# Patient Record
Sex: Female | Born: 1985 | Race: Black or African American | Hispanic: No | Marital: Single | Smoking: Never smoker
Health system: Southern US, Community
[De-identification: ages and names within clinical notes are randomized; demographics above are authoritative.]

## PROBLEM LIST (undated history)

## (undated) DIAGNOSIS — D571 Sickle-cell disease without crisis: Secondary | ICD-10-CM

## (undated) HISTORY — DX: Sickle-cell disease without crisis: D57.1

---
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
## 2003-11-30 ENCOUNTER — Other Ambulatory Visit: Admission: RE | Admit: 2003-11-30 | Discharge: 2003-11-30 | Payer: Self-pay | Admitting: Obstetrics and Gynecology

## 2004-07-09 ENCOUNTER — Other Ambulatory Visit: Admission: RE | Admit: 2004-07-09 | Discharge: 2004-07-09 | Payer: Self-pay | Admitting: Obstetrics and Gynecology

## 2013-08-29 ENCOUNTER — Emergency Department (HOSPITAL_COMMUNITY)
Admission: EM | Admit: 2013-08-29 | Discharge: 2013-08-29 | Disposition: A | Discharge status: Home / Self Care | Payer: Self-pay | Attending: Emergency Medicine | Admitting: Emergency Medicine

## 2013-08-29 ENCOUNTER — Encounter (HOSPITAL_COMMUNITY): Payer: Self-pay | Admitting: Emergency Medicine

## 2013-08-29 DIAGNOSIS — M542 Cervicalgia: Secondary | ICD-10-CM

## 2013-08-29 DIAGNOSIS — M545 Low back pain, unspecified: Secondary | ICD-10-CM

## 2013-08-29 DIAGNOSIS — Y92410 Unspecified street and highway as the place of occurrence of the external cause: Secondary | ICD-10-CM | POA: Insufficient documentation

## 2013-08-29 DIAGNOSIS — Y9389 Activity, other specified: Secondary | ICD-10-CM | POA: Insufficient documentation

## 2013-08-29 DIAGNOSIS — IMO0002 Reserved for concepts with insufficient information to code with codable children: Secondary | ICD-10-CM | POA: Insufficient documentation

## 2013-08-29 DIAGNOSIS — S0993XA Unspecified injury of face, initial encounter: Secondary | ICD-10-CM | POA: Insufficient documentation

## 2013-08-29 DIAGNOSIS — S199XXA Unspecified injury of neck, initial encounter: Principal | ICD-10-CM

## 2013-08-29 MED ORDER — CYCLOBENZAPRINE HCL 10 MG PO TABS: 10.0000 mg | ORAL_TABLET | Freq: Two times a day (BID) | ORAL | Status: DC | PRN

## 2013-08-29 MED ORDER — MELOXICAM 7.5 MG PO TABS: 15.0000 mg | ORAL_TABLET | Freq: Every day | ORAL | Status: DC
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
## 2013-08-29 NOTE — Progress Notes (Signed)
[facility] provided pt with a list of primary care resources and a [facility] [employer] application to help patient establish primary care.  °
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
## 2013-08-29 NOTE — Discharge Instructions (Signed)
Take flexeril as needed for muscle spasms.  It may cause drowsiness, do not drive while taking.  Mobic is an antiinflammatory.  Today for next 7 days, then as needed for pain.

## 2013-08-30 NOTE — ED Provider Notes (Signed)
Medical screening examination/treatment/procedure(s) were performed by non-physician practitioner and as supervising physician I was immediately available for consultation/collaboration.    Shanna CiscoMegan E Docherty, MD 08/30/13 [id number]

## 2014-09-16 ENCOUNTER — Emergency Department (HOSPITAL_BASED_OUTPATIENT_CLINIC_OR_DEPARTMENT_OTHER)
Admission: EM | Admit: 2014-09-16 | Discharge: 2014-09-16 | Disposition: A | Discharge status: Home / Self Care | Payer: 59 | Attending: Emergency Medicine | Admitting: Emergency Medicine

## 2014-09-16 ENCOUNTER — Encounter (HOSPITAL_BASED_OUTPATIENT_CLINIC_OR_DEPARTMENT_OTHER): Payer: Self-pay | Admitting: Emergency Medicine

## 2014-09-16 DIAGNOSIS — Z791 Long term (current) use of non-steroidal anti-inflammatories (NSAID): Secondary | ICD-10-CM | POA: Insufficient documentation

## 2014-09-16 DIAGNOSIS — Z3202 Encounter for pregnancy test, result negative: Secondary | ICD-10-CM | POA: Diagnosis not present

## 2014-09-16 DIAGNOSIS — R300 Dysuria: Secondary | ICD-10-CM | POA: Diagnosis present

## 2014-09-16 DIAGNOSIS — N390 Urinary tract infection, site not specified: Secondary | ICD-10-CM | POA: Diagnosis not present

## 2014-09-16 LAB — URINE MICROSCOPIC-ADD ON

## 2014-09-16 LAB — URINALYSIS, ROUTINE W REFLEX MICROSCOPIC
Bilirubin Urine: NEGATIVE
Glucose, UA: NEGATIVE mg/dL
Ketones, ur: NEGATIVE mg/dL
Nitrite: POSITIVE — AB
Protein, ur: NEGATIVE mg/dL
Specific Gravity, Urine: 1.01 (ref 1.005–1.030)
Urobilinogen, UA: 0.2 mg/dL (ref 0.0–1.0)
pH: 6.5 (ref 5.0–8.0)

## 2014-09-16 LAB — PREGNANCY, URINE: Preg Test, Ur: NEGATIVE

## 2014-09-16 MED ORDER — CEPHALEXIN 500 MG PO CAPS: 500.0000 mg | ORAL_CAPSULE | Freq: Four times a day (QID) | ORAL | Status: DC

## 2014-09-16 MED ORDER — PHENAZOPYRIDINE HCL 200 MG PO TABS: 200.0000 mg | ORAL_TABLET | Freq: Three times a day (TID) | ORAL | Status: DC | PRN

## 2014-09-16 NOTE — ED Provider Notes (Signed)
CSN: [id number]     Arrival date & time 09/16/14  [id number] History   First MD Initiated Contact with Patient 09/16/14 [phone]     Chief Complaint  Patient presents with  . Dysuria     (Consider location/radiation/quality/duration/timing/severity/associated sxs/prior Treatment) HPI Comments: Urinary frequency and dysuria. Symptoms have worsened in the last 24 hours. She has not had any fever, nausea, vomiting, diarrhea or back pain. She reports that today the urine is dark and may have had some blood in it. She denies vaginal bleeding and vaginal discharge.  Patient is a 29 y.o. female presenting with dysuria.  Dysuria   History reviewed. No pertinent past medical history. History reviewed. No pertinent past surgical history. No family history on file. History  Substance Use Topics  . Smoking status: Never Smoker   . Smokeless tobacco: Not on file  . Alcohol Use: Yes     Comment: social   OB History    No data available     Review of Systems  Genitourinary: Positive for dysuria and frequency.  All other systems reviewed and are negative.     Allergies  Review of patient's allergies indicates no known allergies.  Home Medications   Prior to Admission medications   Medication Sig Start Date End Date Taking? Authorizing Provider  cyclobenzaprine (FLEXERIL) 10 MG tablet Take 1 tablet (10 mg total) by mouth 2 (two) times daily as needed for muscle spasms. 08/29/13   Junius FinnerErin O'Malley, PA-C  meloxicam (MOBIC) 7.5 MG tablet Take 2 tablets (15 mg total) by mouth daily. 08/29/13   Junius FinnerErin O'Malley, PA-C   BP 118/58 mmHg  Pulse 77  Temp(Src) 98.8 F (37.1 C) (Oral)  Resp 16  Ht 5\' 1"  (1.549 m)  Wt 128 lb 11.2 oz (58.378 kg)  BMI 24.33 kg/m2  SpO2 100%  LMP 08/23/2014 (Exact Date) Physical Exam  Constitutional: She is oriented to person, place, and time. She appears well-developed and well-nourished. No distress.  HENT:  Head: Normocephalic and atraumatic.  Right Ear: Hearing normal.   Left Ear: Hearing normal.  Nose: Nose normal.  Mouth/Throat: Oropharynx is clear and moist and mucous membranes are normal.  Eyes: Conjunctivae and EOM are normal. Pupils are equal, round, and reactive to light.  Neck: Normal range of motion. Neck supple.  Cardiovascular: Regular rhythm, S1 normal and S2 normal.  Exam reveals no gallop and no friction rub.   No murmur heard. Pulmonary/Chest: Effort normal and breath sounds normal. No respiratory distress. She exhibits no tenderness.  Abdominal: Soft. Normal appearance and bowel sounds are normal. There is no hepatosplenomegaly. There is no tenderness. There is no rebound, no guarding, no tenderness at McBurney's point and negative Murphy's sign. No hernia.  Musculoskeletal: Normal range of motion.  Neurological: She is alert and oriented to person, place, and time. She has normal strength. No cranial nerve deficit or sensory deficit. Coordination normal. GCS eye subscore is 4. GCS verbal subscore is 5. GCS motor subscore is 6.  Skin: Skin is warm, dry and intact. No rash noted. No cyanosis.  Psychiatric: She has a normal mood and affect. Her speech is normal and behavior is normal. Thought content normal.  Nursing note and vitals reviewed.   ED Course  Procedures (including critical care time) Labs Review Labs Reviewed  URINALYSIS, ROUTINE W REFLEX MICROSCOPIC (NOT AT [facility])  PREGNANCY, URINE    Imaging Review No results found.   EKG Interpretation None      MDM   Final diagnoses:  None   UTI  Resents with urinary frequency and dysuria. Urinalysis positive for infection. Treat with Keflex and Pyridium.    Gilda Crease, MD 09/16/14 1047

## 2014-09-16 NOTE — ED Notes (Signed)
Pt states frequency and painful urination, worsening in past 24hours since Tuesday.  Pt also states her urine appears to have a brownish tinge to it today.  Denies any abdominal or back pain.

## 2014-09-16 NOTE — Discharge Instructions (Signed)

## 2014-09-17 ENCOUNTER — Encounter (HOSPITAL_BASED_OUTPATIENT_CLINIC_OR_DEPARTMENT_OTHER): Payer: Self-pay | Admitting: Emergency Medicine

## 2014-09-17 ENCOUNTER — Emergency Department (HOSPITAL_BASED_OUTPATIENT_CLINIC_OR_DEPARTMENT_OTHER)
Admission: EM | Admit: 2014-09-17 | Discharge: 2014-09-17 | Disposition: A | Discharge status: Home / Self Care | Payer: 59 | Attending: Emergency Medicine | Admitting: Emergency Medicine

## 2014-09-17 DIAGNOSIS — Z87440 Personal history of urinary (tract) infections: Secondary | ICD-10-CM | POA: Insufficient documentation

## 2014-09-17 DIAGNOSIS — Z791 Long term (current) use of non-steroidal anti-inflammatories (NSAID): Secondary | ICD-10-CM | POA: Insufficient documentation

## 2014-09-17 DIAGNOSIS — N760 Acute vaginitis: Secondary | ICD-10-CM | POA: Diagnosis not present

## 2014-09-17 DIAGNOSIS — B9689 Other specified bacterial agents as the cause of diseases classified elsewhere: Secondary | ICD-10-CM

## 2014-09-17 DIAGNOSIS — Z792 Long term (current) use of antibiotics: Secondary | ICD-10-CM | POA: Insufficient documentation

## 2014-09-17 DIAGNOSIS — L293 Anogenital pruritus, unspecified: Secondary | ICD-10-CM | POA: Diagnosis present

## 2014-09-17 LAB — WET PREP, GENITAL: TRICH WET PREP: NONE SEEN

## 2014-09-17 MED ORDER — METRONIDAZOLE 500 MG PO TABS: 500.0000 mg | ORAL_TABLET | Freq: Two times a day (BID) | ORAL | Status: DC

## 2014-09-17 NOTE — ED Provider Notes (Signed)
CSN: [id number]     Arrival date & time 09/17/14  1628 History   First MD Initiated Contact with Patient 09/17/14 1811     Chief Complaint  Patient presents with  . Vaginal Itching     (Consider location/radiation/quality/duration/timing/severity/associated sxs/prior Treatment) HPI   Brooke Bridges is a 29 y.o. female complaining of vaginal itching onset today. Patient also started her menstruation today. Patient was seen for UTI yesterday, she has not filled prescription for antibiotics. She denies fever, chills, nausea, vomiting, abdominal pain, flank pain, abnormal vaginal discharge. She states that it'll typically gets this itching sensation when she begins her menses. Does not have OB/GYN.  History reviewed. No pertinent past medical history. History reviewed. No pertinent past surgical history. No family history on file. History  Substance Use Topics  . Smoking status: Never Smoker   . Smokeless tobacco: Not on file  . Alcohol Use: Yes     Comment: social   OB History    No data available     Review of Systems  10 systems reviewed and found to be negative, except as noted in the HPI.   Allergies  Review of patient's allergies indicates no known allergies.  Home Medications   Prior to Admission medications   Medication Sig Start Date End Date Taking? Authorizing Provider  cephALEXin (KEFLEX) 500 MG capsule Take 1 capsule (500 mg total) by mouth 4 (four) times daily. 09/16/14   Gilda Crease, MD  cyclobenzaprine (FLEXERIL) 10 MG tablet Take 1 tablet (10 mg total) by mouth 2 (two) times daily as needed for muscle spasms. 08/29/13   Junius Finner, PA-C  meloxicam (MOBIC) 7.5 MG tablet Take 2 tablets (15 mg total) by mouth daily. 08/29/13   Junius Finner, PA-C  metroNIDAZOLE (FLAGYL) 500 MG tablet Take 1 tablet (500 mg total) by mouth 2 (two) times daily. One po bid x 7 days 09/17/14   Joni Reining Kayton Ripp, PA-C  phenazopyridine (PYRIDIUM) 200 MG tablet Take 1 tablet (200 mg  total) by mouth 3 (three) times daily as needed for pain. 09/16/14   Gilda Crease, MD   BP 128/79 mmHg  Pulse 72  Temp(Src) 98.5 F (36.9 C) (Oral)  Resp 16  Ht  (1.549 m)  Wt 128 lb (58.06 kg)  BMI 24.20 kg/m2  SpO2 100%  LMP 09/17/2014 Physical Exam  Constitutional: She is oriented to person, place, and time. She appears well-developed and well-nourished. No distress.  HENT:  Head: Normocephalic and atraumatic.  Mouth/Throat: Oropharynx is clear and moist.  Eyes: Conjunctivae and EOM are normal. Pupils are equal, round, and reactive to light.  Neck: Normal range of motion.  Cardiovascular: Normal rate, regular rhythm and intact distal pulses.   Pulmonary/Chest: Effort normal and breath sounds normal.  Abdominal: Soft. There is no tenderness.  Genitourinary:  Exam a chaperoned by nurse: No rashes or lesions, there is dark blood pooled in the posterior fornix. No cervical motion or adnexal tenderness.  Musculoskeletal: Normal range of motion.  Neurological: She is alert and oriented to person, place, and time.  Skin: She is not diaphoretic.  Psychiatric: She has a normal mood and affect.  Nursing note and vitals reviewed.   ED Course  Procedures (including critical care time) Labs Review Labs Reviewed  WET PREP, GENITAL - Abnormal; Notable for the following:    Yeast Wet Prep HPF POC FEW (*)    Clue Cells Wet Prep HPF POC MODERATE (*)    WBC, Wet Prep HPF POC  MODERATE (*)    All other components within normal limits  RPR  HIV ANTIBODY (ROUTINE TESTING)  GC/CHLAMYDIA PROBE AMP () NOT AT [facility]    Imaging Review No results found.   EKG Interpretation None      MDM   Final diagnoses:  BV (bacterial vaginosis)    Filed Vitals:   09/17/14 1642 09/17/14 1915 09/17/14 1932  BP: 128/78 139/93 128/79  Pulse: 83 74 72  Temp: 98.5 F (36.9 C)    TempSrc: Oral    Resp: 18 18 16   Height: 5\' 1"  (1.549 m)    Weight: 128 lb (58.06 kg)    SpO2:  100% 100% 100%     [name] is a pleasant 29 y.o. female presenting with vaginal irritation which she gets when she starts her menses. Pelvic exam with no significant abnormality, there is a moderate amount of clue cells and a moderate amount of white blood cells. No cervical motion or adnexal tenderness, patient denies any abnormal vaginal discharge however pruritus may be a symptoms of BV. I've advised her that she will need to follow with OB/GYN on this and I will start her on Flagyl. I've also advised this patient to pick up her antibiotics for her UTI. Patient will be given resource guide so she can establish outpatient care.  Evaluation does not show pathology that would require ongoing emergent intervention or inpatient treatment. Pt is hemodynamically stable and mentating appropriately. Discussed findings and plan with patient/guardian, who agrees with care plan. All questions answered. Return precautions discussed and outpatient follow up given.   Discharge Medication List as of 09/17/2014  7:17 PM    START taking these medications   Details  metroNIDAZOLE (FLAGYL) 500 MG tablet Take 1 tablet (500 mg total) by mouth 2 (two) times daily. One po bid x 7 days, Starting 09/17/2014, Until Discontinued, [employer]             Maalik Pinn, PA-C 09/18/14 [id number]  Rolan BuccoMelanie Belfi, MD 09/18/14 1446

## 2014-09-17 NOTE — Discharge Instructions (Signed)
Take your antibiotics as directed and to completion. You should never have any leftover antibiotics! Push fluids and stay well hydrated.   Any antibiotic use can reduce the efficacy of hormonal birth control. Please use back up method of contraception.   Do not drink alcohol while you are taking flagyl (metronidazole) because it will make you very sick.  Do not hesitate to return to the emergency room for any new, worsening or concerning symptoms.  Please obtain primary care using resource guide below. Let them know that you were seen in the emergency room and that they will need to obtain records for further outpatient management.   Bacterial Vaginosis Bacterial vaginosis is a vaginal infection that occurs when the normal balance of bacteria in the vagina is disrupted. It results from an overgrowth of certain bacteria. This is the most common vaginal infection in women of childbearing age. Treatment is important to prevent complications, especially in pregnant women, as it can cause a premature delivery. CAUSES  Bacterial vaginosis is caused by an increase in harmful bacteria that are normally present in smaller amounts in the vagina. Several different kinds of bacteria can cause bacterial vaginosis. However, the reason that the condition develops is not fully understood. RISK FACTORS Certain activities or behaviors can put you at an increased risk of developing bacterial vaginosis, including:  Having a new sex partner or multiple sex partners.  Douching.  Using an intrauterine device (IUD) for contraception. Women do not get bacterial vaginosis from toilet seats, bedding, swimming pools, or contact with objects around them. SIGNS AND SYMPTOMS  Some women with bacterial vaginosis have no signs or symptoms. Common symptoms include:  Grey vaginal discharge.  A fishlike odor with discharge, especially after sexual intercourse.  Itching or burning of the vagina and vulva.  Burning or  pain with urination. DIAGNOSIS  Your health care provider will take a medical history and examine the vagina for signs of bacterial vaginosis. A sample of vaginal fluid may be taken. Your health care provider will look at this sample under a microscope to check for bacteria and abnormal cells. A vaginal pH test may also be done.  TREATMENT  Bacterial vaginosis may be treated with antibiotic medicines. These may be given in the form of a pill or a vaginal cream. A second round of antibiotics may be prescribed if the condition comes back after treatment.  HOME CARE INSTRUCTIONS   Only take over-the-counter or prescription medicines as directed by your health care provider.  If antibiotic medicine was prescribed, take it as directed. Make sure you finish it even if you start to feel better.  Do not have sex until treatment is completed.  Tell all sexual partners that you have a vaginal infection. They should see their health care provider and be treated if they have problems, such as a mild rash or itching.  Practice safe sex by using condoms and only having one sex partner. SEEK MEDICAL CARE IF:   Your symptoms are not improving after 3 days of treatment.  You have increased discharge or pain.  You have a fever. MAKE SURE YOU:   Understand these instructions.  Will watch your condition.  Will get help right away if you are not doing well or get worse. FOR MORE INFORMATION  Centers for Disease Control and Prevention, Division of STD Prevention: SolutionApps.co.za American Sexual Health Association (ASHA): www.ashastd.org  Document Released: 03/31/2005 Document Revised: 01/19/2013 Document Reviewed: 11/10/2012 [facility] Patient Information 2015 [place], [place]. This information is  not intended to replace advice given to you by your health care provider. Make sure you discuss any questions you have with your health care provider.   Emergency Department Resource Guide 1) Find a Doctor and  Pay Out of Pocket Although you won't have to find out who is covered by your insurance plan, it is a good idea to ask around and get recommendations. You will then need to call the office and see if the doctor you have chosen will accept you as a new patient and what types of options they offer for patients who are self-pay. Some doctors offer discounts or will set up payment plans for their patients who do not have insurance, but you will need to ask so you aren't surprised when you get to your appointment.  2) Contact Your Local Health Department Not all health departments have doctors that can see patients for sick visits, but many do, so it is worth a call to see if yours does. If you don't know where your local health department is, you can check in your phone book. The CDC also has a tool to help you locate your state's health department, and many state websites also have listings of all of their local health departments.  3) Find a Walk-in Clinic If your illness is not likely to be very severe or complicated, you may want to try a walk in clinic. These are popping up all over the country in pharmacies, drugstores, and shopping centers. They're usually staffed by nurse practitioners or physician assistants that have been trained to treat common illnesses and complaints. They're usually fairly quick and inexpensive. However, if you have serious medical issues or chronic medical problems, these are probably not your best option.  No Primary Care Doctor: - Call Health Connect at  [phone] - they can help you locate a primary care doctor that  accepts your insurance, provides certain services, etc. - Physician Referral Service- [phone]  Chronic Pain Problems: Organization         Address  Phone   Notes  [facility]  [phone] Patients need to be referred by their primary care doctor.   Medication Assistance: Organization         Address  Phone   Notes  [facility] Medication [facility] [address] [place] [place], [place] [address] [phone] --Must be a resident of [facility] -- Must have NO insurance coverage whatsoever (no Medicaid/ Medicare, etc.) -- The pt. MUST have a primary care doctor that directs their care regularly and follows them in the community   Med[facility]ist  [phone]   [employer]  [phone]    Agencies that provide inexpensive medical care: Organization         Address  Phone   Notes  [facility]  [phone]   [facility]    [phone]   [facility] [address] [place], [place] [address] [phone]   [facility] [address] [place], [place] [phone]   Planned Parenthood    [phone]   [facility]    [phone]   Community Health and [facility]  [address], [place] Phone:  [phone], Fax:  [phone] Hours of Operation:  9 am - 6 pm, M-F.  Also accepts Medicaid/Medicare and self-pay.  [facility]  [address], [place] Phone: [phone], Fax: [phone]. Hours of Operation:  8:30 am - 5:30 pm, M-F.  Also accepts Medicaid and self-pay.  [facility] [place] [address] [place] Phone: [phone]   [facility] [address] Natasha Bence [place], [place] [phone], Ext. [phone] Mondays & Thursdays: 7-9 AM.  First 15 patients are seen on a first come, first serve basis.    Medicaid-accepting [facility] Providers:  Organization         Address  Phone   Notes  [facility] [address],  [phone] Also accepts self-pay patients.  [facility] [address] Laurell Josephs [place], [place]  [phone]   [facility] [address], [place] [phone]   [facility] [address], [place] [phone]   Renaye Rakers [address], [place]   [phone] Only accepts [place] patients after they have their name applied to their card.   Self-Pay (no insurance) in [facility]:  Organization         Address  Phone   Notes  Sickle Cell Patients, [facility] [address] [place], [place] [phone]   [facility] [address] [place], [place] [phone]   [facility] [place]  [address], [place] [phone]   [facility]/Dr. Osei-Bonsu  [address], [place] or [address], [place] [phone] Phone number for both [place] and [place] locations is the same.  [facility] [address] [place] [phone]   [facility] [address] [place] or [address] [phone]   [facility] [address] [place] [phone], phone; [phone], fax Sees patients 1st and 3rd Saturday of every month.  Must not qualify for public or private insurance (i.e. Medicaid, Medicare, [place] Health Choice, Veterans' Benefits)  Household income should be no more than 200% of the poverty level The clinic cannot treat you if you are pregnant or think you are pregnant  Sexually transmitted diseases are not treated at the clinic.    Dental Care: Organization         Address  Phone  Notes  [facility] Department of [facility] [address] [place], [place] [phone] Accepts children up to age 9 who are enrolled in [place] or [place] Health Choice; pregnant women with a Medicaid card; and children who have applied for Medicaid or [place] Health Choice, but were declined, whose parents can pay a reduced fee at time of service.  [facility] Department of [facility]  [address], [place] [phone] Accepts children up to age 33 who are enrolled in [place] or [place] Health Choice; pregnant women with a Medicaid card; and children who have applied for Medicaid or  Health Choice, but were declined, whose parents can pay a reduced fee at time of service.  [facility]  [address] [place], [place] [phone] Patients are seen by appointment only. Walk-ins are not accepted. [facility] will see patients 53 years of age and older. Monday - Tuesday (8am-5pm) Most Wednesdays (8:30-5pm) $30 per visit, cash only  [facility]  [address], [facility] [phone] Patients are seen by appointment only. Walk-ins are not accepted. [facility] will see patients 58 years of age and older. One Wednesday Evening (Monthly: Volunteer Based).  $30 per visit, cash only  [employer]  [phone] for adults; Children under age 87, call Graduate Pediatric Dentistry at [phone]. Children aged 81-14, please call [phone] to request a pediatric application.  Dental services are provided in all areas of dental care including fillings, crowns and bridges, complete and partial dentures, implants, gum treatment, root canals, and extractions. Preventive care is also provided. Treatment is provided to both adults and children. Patients are selected via a lottery and there is often a waiting list.   [facility] [address], [place]  [phone] [email or web address]   [facility] [address] [place], [place] [phone], Ext. [phone] Second and Fourth Thursday of each month, opens at 6:30 AM; Clinic ends at 9 AM.  Patients are seen on a first-come first-served basis, and a limited number are seen during each clinic.   [facility]  [address] Ether Griffins [place], [place] [phone]   Eligibility Requirements You must have lived in [place], [place], or  [place] counties for at least the last three months.   You cannot be eligible for state or federal sponsored [place], including [employer], [place], or [employer].   You generally cannot be eligible for healthcare insurance through your employer.    How to apply: Eligibility screenings are held every Tuesday and Wednesday afternoon from 1:00 pm until 4:00 pm. You do not need an appointment for the interview!  [facility] [address] [place], [place] [phone]   [facility]  [phone]   [facility] Health Department  [phone]   [facility] Health Department  [phone]    Behavioral Health Resources in the Community: Intensive Outpatient Programs Organization         Address  Phone  Notes  [facility] [address] [place], [place] [phone]   [facility] Outpatient [address], [place], [place] [phone]   [facility] [address], [place], [place]  [phone]   [facility] [address],  [place], [place] [phone] or [phone]   Substance Abuse Resources Organization         Address  Phone  Notes  [facility] Drug Services  [phone]   [facility]  [phone]   [facility] [place]  [phone]   Floydene Flock  [phone]   Residential & Outpatient Substance Abuse Program  [phone]   Psychological Services Organization         Address  Phone  Notes  [facility] Behavioral Health  [phone]   [facility]  [phone]   [facility] Mental Health [address] [place] [phone] or [phone]    Mobile Crisis Teams Organization         Address  Phone  Notes  [facility], Mobile Crisis Care Unit  [phone]   [facility]  [address] [place], [place] [phone]   Doristine Locks [address] [place] [phone]    Self-Help/Support Groups Organization         Address  Phone             Notes  [facility] [place] -  variety of support groups  [phone]- [phone] Call for more information  Narcotics Anonymous (NA), [facility] [address], [place]  2 meetings at this location   Residential [facility] administratorTreatment Programs Organization         Address  Phone  Notes  [facility] [address],    [place]  [phone]   [facility]  [address], [place] [phone], [place], [place] [phone]   [facility] [address], [place] [phone] Admissions: 8am-3pm M-F  [facility] [address]    [place], [place] [phone]   [facility] [address], [place], [place] [phone]   [facility] [address]  [place], [place] [phone]   [facility] - Intensive Outpatient [address], [place], [place] [phone]   [facility] [address]  [place], [place] [phone] or [phone]   [facility] ([facility]) [address] [place], [place] [phone] Accepts Medicaid  [facility] [address]  [place] [phone] Substance Abuse/Addiction Treatment   [facility] Behavioral Health Resources Organization         Address  Phone  Notes  [facility]  [phone]) [phone]   Angie FavaJulie Brannon, PhD [address] [place], [place]   [phone](336) [phone] or [phone](336) [phone]   [facility]   [address] [place], [place] [phone]) [phone]   [facility] [address], [place], [place] [phone](336) [phone] Insurance/Medicaid/sponsorship through [facility]  [facility] [address]                                    [place], [place] [phone]) [phone] Therapy/tele-psych/case  [facility] [address]   [place], [place] [phone]     Dr. Lolly MustacheArfeen  [phone](336) [phone]   [facility] [place] [facility]  [employer] [facility] 1) [address], [place] 2) [address], [place] 3)  [address] [place] [address], [place] [phone]) [phone]) [phone]  [phone](336) [phone]   [facility] Child Abuse Hotline [phone](336) 2030972704 or [phone](336) 7475318374 (After Hours)

## 2014-09-17 NOTE — ED Notes (Signed)
Pt was seen here yesterday for dysuria.  She has not taken any rx's antibiotics yet bc today she woke up with vaginal "discomfort and itching."  Pt started her menstrual period this morning.

## 2014-09-17 NOTE — ED Notes (Signed)
Pt also states headache and dizziness last night.

## 2014-09-18 LAB — GC/CHLAMYDIA PROBE AMP (~~LOC~~) NOT AT ARMC
CHLAMYDIA, DNA PROBE: NEGATIVE
Neisseria Gonorrhea: NEGATIVE

## 2014-09-19 LAB — RPR: RPR Ser Ql: NONREACTIVE

## 2014-09-19 LAB — HIV ANTIBODY (ROUTINE TESTING W REFLEX): HIV SCREEN 4TH GENERATION: NONREACTIVE

## 2014-11-02 ENCOUNTER — Encounter: Payer: 59 | Admitting: Family Medicine

## 2016-09-27 ENCOUNTER — Emergency Department (HOSPITAL_COMMUNITY)
Admission: EM | Admit: 2016-09-27 | Discharge: 2016-09-27 | Discharge status: Against Medical Advice | Payer: 59 | Attending: Emergency Medicine | Admitting: Emergency Medicine

## 2016-09-27 ENCOUNTER — Encounter (HOSPITAL_COMMUNITY): Payer: Self-pay

## 2016-09-27 DIAGNOSIS — Y929 Unspecified place or not applicable: Secondary | ICD-10-CM | POA: Insufficient documentation

## 2016-09-27 DIAGNOSIS — Y998 Other external cause status: Secondary | ICD-10-CM | POA: Diagnosis not present

## 2016-09-27 DIAGNOSIS — T07XXXA Unspecified multiple injuries, initial encounter: Secondary | ICD-10-CM | POA: Diagnosis present

## 2016-09-27 DIAGNOSIS — Y939 Activity, unspecified: Secondary | ICD-10-CM | POA: Insufficient documentation

## 2016-09-27 DIAGNOSIS — W57XXXA Bitten or stung by nonvenomous insect and other nonvenomous arthropods, initial encounter: Secondary | ICD-10-CM | POA: Insufficient documentation

## 2016-09-27 NOTE — ED Triage Notes (Signed)
Pt complaining of bed bugs in home. Pt with multiple bites to body.

## 2018-02-05 ENCOUNTER — Encounter (HOSPITAL_BASED_OUTPATIENT_CLINIC_OR_DEPARTMENT_OTHER): Payer: Self-pay

## 2018-02-05 ENCOUNTER — Other Ambulatory Visit: Payer: Self-pay

## 2018-02-05 ENCOUNTER — Emergency Department (HOSPITAL_BASED_OUTPATIENT_CLINIC_OR_DEPARTMENT_OTHER)
Admission: EM | Admit: 2018-02-05 | Discharge: 2018-02-05 | Disposition: A | Discharge status: Home / Self Care | Payer: 59 | Attending: Emergency Medicine | Admitting: Emergency Medicine

## 2018-02-05 DIAGNOSIS — Z79899 Other long term (current) drug therapy: Secondary | ICD-10-CM | POA: Diagnosis not present

## 2018-02-05 DIAGNOSIS — K5900 Constipation, unspecified: Secondary | ICD-10-CM | POA: Diagnosis not present

## 2018-02-05 DIAGNOSIS — R1010 Upper abdominal pain, unspecified: Secondary | ICD-10-CM | POA: Diagnosis present

## 2018-02-05 DIAGNOSIS — D649 Anemia, unspecified: Secondary | ICD-10-CM | POA: Diagnosis not present

## 2018-02-05 LAB — CBC WITH DIFFERENTIAL/PLATELET
Abs Immature Granulocytes: 0.02 10*3/uL (ref 0.00–0.07)
BASOS ABS: 0.1 10*3/uL (ref 0.0–0.1)
Basophils Relative: 1 %
Eosinophils Absolute: 0.1 10*3/uL (ref 0.0–0.5)
Eosinophils Relative: 3 %
HCT: 31 % — ABNORMAL LOW (ref 36.0–46.0)
Hemoglobin: 9.5 g/dL — ABNORMAL LOW (ref 12.0–15.0)
Immature Granulocytes: 0 %
Lymphocytes Relative: 42 %
Lymphs Abs: 2.1 10*3/uL (ref 0.7–4.0)
MCH: 21.5 pg — ABNORMAL LOW (ref 26.0–34.0)
MCHC: 30.6 g/dL (ref 30.0–36.0)
MCV: 70.3 fL — ABNORMAL LOW (ref 80.0–100.0)
Monocytes Absolute: 0.5 10*3/uL (ref 0.1–1.0)
Monocytes Relative: 10 %
NEUTROS PCT: 44 %
NRBC: 0 % (ref 0.0–0.2)
Neutro Abs: 2.1 10*3/uL (ref 1.7–7.7)
Platelets: 413 10*3/uL — ABNORMAL HIGH (ref 150–400)
RBC: 4.41 MIL/uL (ref 3.87–5.11)
RDW: 17.9 % — ABNORMAL HIGH (ref 11.5–15.5)
WBC: 4.9 10*3/uL (ref 4.0–10.5)

## 2018-02-05 LAB — COMPREHENSIVE METABOLIC PANEL
ALBUMIN: 3.9 g/dL (ref 3.5–5.0)
ALT: 23 U/L (ref 0–44)
ANION GAP: 9 (ref 5–15)
AST: 31 U/L (ref 15–41)
Alkaline Phosphatase: 61 U/L (ref 38–126)
BUN: 10 mg/dL (ref 6–20)
CO2: 22 mmol/L (ref 22–32)
Calcium: 8.5 mg/dL — ABNORMAL LOW (ref 8.9–10.3)
Chloride: 104 mmol/L (ref 98–111)
Creatinine, Ser: 0.68 mg/dL (ref 0.44–1.00)
GFR calc Af Amer: >60 mL/min (ref >60)
GFR calc non Af Amer: >60 mL/min (ref >60)
GLUCOSE: 82 mg/dL (ref 70–99)
Potassium: 3.8 mmol/L (ref 3.5–5.1)
SODIUM: 135 mmol/L (ref 135–145)
Total Bilirubin: 0.4 mg/dL (ref 0.3–1.2)
Total Protein: 7.3 g/dL (ref 6.5–8.1)

## 2018-02-05 LAB — PREGNANCY, URINE: Preg Test, Ur: NEGATIVE

## 2018-02-05 LAB — LIPASE, BLOOD: Lipase: 31 U/L (ref 11–51)

## 2018-02-05 NOTE — ED Triage Notes (Signed)
C/o abd pain, nausea, constipation-sx started yesterday-NAD-steady gait

## 2018-02-05 NOTE — Discharge Instructions (Signed)
Use 1-2 capfuls of miralax daily.  Increase the amount of water your drinking. Your urine should be clear to pale yellow.  Increase your fiber intake.  Follow up with your primary care doctor if you continue to feel like you are not evacuating completely. Follow up with your primary care regarding your low blood counts.  Return to the ER if you develop fevers, severe abdominal pain, inability to have a bowel movement/not passing gas, persistent vomiting, or any new or concerning symptoms.

## 2018-02-05 NOTE — ED Notes (Signed)
C/o abd pain w nausea onset yesterday increased urinary freq,  Denies burning,  No vag dc

## 2018-02-05 NOTE — ED Provider Notes (Signed)
[facility] EMERGENCY DEPARTMENT Provider Note   CSN: [id number] Arrival date & time: 02/05/18  1446     History   Chief Complaint Chief Complaint  Patient presents with  . Abdominal Pain    HPI Brooke Bridges is a 32 y.o. female presenting for evaluation of abdominal discomfort.  Patient states 2 days ago, she had an episode of upper abdominal discomfort with nausea.  She drinks ginger ale, and the nausea resolved.  Since then, she has been having intermittent episodes of upper abdominal discomfort.  This is more likely to happen when she is moving or bending.  Additionally, she states she is not pooping like normal.  She states she has to strain a lot to have a bowel movement, and she does not feel like she is fully evacuating.  She reports decreased appetite, but no pain or nausea with PO intake.  Her last bowel movement was today, it was nonbloody.  She denies fevers, chills, chest pain, shortness of breath, current nausea, vomiting, or urinary symptoms.  She denies vaginal discharge.  Her last period was 2 weeks ago.  She has not taken anything for her symptoms.  She thinks this might be due to constipation, but was hesitant to take a laxative because she wanted to make sure this was the cause.  She has no history of abdominal surgeries.  HPI  History reviewed. No pertinent past medical history.  There are no active problems to display for this patient.   History reviewed. No pertinent surgical history.   OB History   None      Home Medications    Prior to Admission medications   Medication Sig Start Date End Date Taking? Authorizing Provider  cephALEXin (KEFLEX) 500 MG capsule Take 1 capsule (500 mg total) by mouth 4 (four) times daily. 09/16/14   Gilda Crease, MD  cyclobenzaprine (FLEXERIL) 10 MG tablet Take 1 tablet (10 mg total) by mouth 2 (two) times daily as needed for muscle spasms. 08/29/13   Lurene Shadow, PA-C  meloxicam (MOBIC) 7.5 MG tablet  Take 2 tablets (15 mg total) by mouth daily. 08/29/13   Lurene Shadow, PA-C  metroNIDAZOLE (FLAGYL) 500 MG tablet Take 1 tablet (500 mg total) by mouth 2 (two) times daily. One po bid x 7 days 09/17/14   Pisciotta, Joni Reining, PA-C  phenazopyridine (PYRIDIUM) 200 MG tablet Take 1 tablet (200 mg total) by mouth 3 (three) times daily as needed for pain. 09/16/14   Gilda Crease, MD    Family History No family history on file.  Social History Social History   Tobacco Use  . Smoking status: Never Smoker  . Smokeless tobacco: Never Used  Substance Use Topics  . Alcohol use: Yes    Comment: occ  . Drug use: No     Allergies   Patient has no known allergies.   Review of Systems Review of Systems  Gastrointestinal: Positive for abdominal pain (abdominal discomfort), constipation (straining) and nausea (resolved).  All other systems reviewed and are negative.    Physical Exam Updated Vital Signs BP 132/75 (BP Location: Left Arm)   Pulse 68   Temp 98.7 F (37.1 C) (Oral)   Resp 18   Ht 5\' 1"  (1.549 m)   Wt 63 kg   LMP 01/22/2018   SpO2 100%   BMI 26.26 kg/m   Physical Exam  Constitutional: She is oriented to person, place, and time. She appears well-developed and well-nourished. No distress.  Resting comfortably in the bed in no acute distress  HENT:  Head: Normocephalic and atraumatic.  Eyes: Pupils are equal, round, and reactive to light. Conjunctivae and EOM are normal.  Neck: Normal range of motion. Neck supple.  Cardiovascular: Normal rate, regular rhythm and intact distal pulses.  Pulmonary/Chest: Effort normal and breath sounds normal. No respiratory distress. She has no wheezes.  Abdominal: Soft. She exhibits no distension and no mass. There is no tenderness. There is no rebound and no guarding.  No tenderness palpation of the abdomen.  Soft without rigidity, guarding, distention.  No obvious masses.  Negative rebound.  No signs of parotitis.  Musculoskeletal:  Normal range of motion.  Neurological: She is alert and oriented to person, place, and time.  Skin: Skin is warm and dry.  Psychiatric: She has a normal mood and affect.  Nursing note and vitals reviewed.    ED Treatments / Results  Labs (all labs ordered are listed, but only abnormal results are displayed) Labs Reviewed  CBC WITH DIFFERENTIAL/PLATELET - Abnormal; Notable for the following components:      Result Value   Hemoglobin 9.5 (*)    HCT 31.0 (*)    MCV 70.3 (*)    MCH 21.5 (*)    RDW 17.9 (*)    Platelets 413 (*)    All other components within normal limits  COMPREHENSIVE METABOLIC PANEL - Abnormal; Notable for the following components:   Calcium 8.5 (*)    All other components within normal limits  LIPASE, BLOOD  PREGNANCY, URINE    EKG None  Radiology No results found.  Procedures Procedures (including critical care time)  Medications Ordered in ED Medications - No data to display   Initial Impression / Assessment and Plan / ED Course  I have reviewed the triage vital signs and the nursing notes.  Pertinent labs & imaging results that were available during my care of the patient were reviewed by me and considered in my medical decision making (see chart for details).     Resenting for evaluation of abdominal discomfort and increased straining while having a bowel movement.  Physical exam reassuring, she is afebrile not tachycardic.  Appears nontoxic.  Abdominal exam without tenderness.  Patient reports her last bowel movement was today.  She is still passing gas.  Low suspicion for obstruction, patient without previous surgeries and no risk factors for obstruction.  Patient without fevers, chills, or acute pain.  Low suspicion for infection.  Visual history obtained from chart review, patient noticed with irritable bowel syndrome.  Patient is concerned that she might have diabetes, because she has a family history of this.  Will obtain basic abdominal labs  and reassess.  Labs reassuring, no leukocytosis.  Kidney, liver, pancreatic function reassuring.  Hemoglobin slightly low at 9.5.  Per chart review, this is baseline for patient.  No obvious bleeding source.  This is likely chronic.  BGL is reassuring, discussed with patient.  Discussed her symptoms are likely multifactorial, including dehydration, poor diet, and incomplete evacuation.  Discussed importance of dietary changes.  Further information given in paperwork.  Discussed follow-up with primary care as needed for further evaluation and for evalaution of anemia.  Discussed use of MiraLAX to encourage daily bowel movements.  At this time, patient appears safe for discharge.  Low suspicion for intra-abdominal infection, perforation, obstruction, or emergent etiology.  Strict return precautions given.  Patient states she understands and agrees plan.   Final Clinical Impressions(s) / ED Diagnoses  Final diagnoses:  Constipation, unspecified constipation type  Anemia, unspecified type    ED Discharge Orders    None       Alveria Apley, PA-C 02/05/18 1625    Arby Barrette, MD 02/17/18 [phone]

## 2018-06-27 ENCOUNTER — Encounter (HOSPITAL_COMMUNITY): Payer: Self-pay | Admitting: *Deleted

## 2018-06-27 ENCOUNTER — Other Ambulatory Visit: Payer: Self-pay

## 2018-06-27 ENCOUNTER — Emergency Department (HOSPITAL_COMMUNITY)
Admission: EM | Admit: 2018-06-27 | Discharge: 2018-06-27 | Disposition: A | Discharge status: Home / Self Care | Payer: 59 | Attending: Emergency Medicine | Admitting: Emergency Medicine

## 2018-06-27 DIAGNOSIS — J069 Acute upper respiratory infection, unspecified: Secondary | ICD-10-CM | POA: Insufficient documentation

## 2018-06-27 DIAGNOSIS — Z79899 Other long term (current) drug therapy: Secondary | ICD-10-CM | POA: Insufficient documentation

## 2018-06-27 DIAGNOSIS — B9789 Other viral agents as the cause of diseases classified elsewhere: Secondary | ICD-10-CM

## 2018-06-27 DIAGNOSIS — R05 Cough: Secondary | ICD-10-CM | POA: Diagnosis present

## 2018-06-27 NOTE — ED Triage Notes (Signed)
Pt states she has been coughing, body aches, fatigue for the past 2 days. Pt states her symptoms are improving but is not completely better and is not sure if she should go to work tomorrow.

## 2018-06-27 NOTE — Discharge Instructions (Signed)
I recommend continuing over-the-counter medications as you have been.  You may also want to do ibuprofen and Tylenol as prescribed at counter for your headache.  Make sure to get plenty of rest and drink plenty of fluids.  Please return to the emergency department if you develop any new or worsening symptoms.

## 2018-06-27 NOTE — ED Provider Notes (Signed)
[facility]-EMERGENCY DEPT Provider Note   CSN: [id number] Arrival date & time: 06/27/18  1651    History   Chief Complaint Chief Complaint  Patient presents with  . Cough  . Generalized Body Aches    HPI Brooke Bridges is a 33 y.o. female who presents with a 4-day history of cough, sore throat, nasal congestion, body aches, chills, headache.  Patient denies any known fever.  She reports she has begun to feel better, however she is wanting to be checked out and determine if she should go back to work.  She has had some bilateral rib pain worse with coughing.  She denies any abdominal pain, nausea, vomiting, urinary symptoms.  She denies any recent travel or known sick contacts.  Patient does work with the public, however though.  Patient has been taking Goody powders and Alka-Seltzer with some relief.     HPI  History reviewed. No pertinent past medical history.  There are no active problems to display for this patient.   History reviewed. No pertinent surgical history.   OB History   No obstetric history on file.      Home Medications    Prior to Admission medications   Medication Sig Start Date End Date Taking? Authorizing Provider  cephALEXin (KEFLEX) 500 MG capsule Take 1 capsule (500 mg total) by mouth 4 (four) times daily. 09/16/14   Gilda Crease, MD  cyclobenzaprine (FLEXERIL) 10 MG tablet Take 1 tablet (10 mg total) by mouth 2 (two) times daily as needed for muscle spasms. 08/29/13   Lurene Shadow, PA-C  meloxicam (MOBIC) 7.5 MG tablet Take 2 tablets (15 mg total) by mouth daily. 08/29/13   Lurene Shadow, PA-C  metroNIDAZOLE (FLAGYL) 500 MG tablet Take 1 tablet (500 mg total) by mouth 2 (two) times daily. One po bid x 7 days 09/17/14   Pisciotta, Joni Reining, PA-C  phenazopyridine (PYRIDIUM) 200 MG tablet Take 1 tablet (200 mg total) by mouth 3 (three) times daily as needed for pain. 09/16/14   Gilda Crease, MD    Family History No  family history on file.  Social History Social History   Tobacco Use  . Smoking status: Never Smoker  . Smokeless tobacco: Never Used  Substance Use Topics  . Alcohol use: Yes    Comment: occ  . Drug use: No     Allergies   Patient has no known allergies.   Review of Systems Review of Systems  Constitutional: Positive for chills. Negative for fever.  HENT: Positive for congestion and sore throat. Negative for facial swelling.   Respiratory: Positive for cough. Negative for shortness of breath.   Cardiovascular: Negative for chest pain.  Gastrointestinal: Negative for abdominal pain, nausea and vomiting.  Genitourinary: Negative for dysuria.  Musculoskeletal: Negative for back pain and neck pain.  Skin: Negative for rash and wound.  Neurological: Positive for headaches.  Psychiatric/Behavioral: The patient is not nervous/anxious.      Physical Exam Updated Vital Signs BP 132/84 (BP Location: Left Arm)   Pulse 84   Temp 98.5 F (36.9 C) (Oral)   Resp 18   LMP 06/13/2018   SpO2 99%   Physical Exam Vitals signs and nursing note reviewed.  Constitutional:      General: She is not in acute distress.    Appearance: She is well-developed. She is not diaphoretic.  HENT:     Head: Normocephalic and atraumatic.     Right Ear: Tympanic membrane normal.  Left Ear: Tympanic membrane normal.     Nose: Congestion present.     Mouth/Throat:     Pharynx: No oropharyngeal exudate or posterior oropharyngeal erythema.     Tonsils: No tonsillar exudate or tonsillar abscesses. Swelling: 1+ on the right. 1+ on the left.  Eyes:     General: No scleral icterus.       Right eye: No discharge.        Left eye: No discharge.     Conjunctiva/sclera: Conjunctivae normal.     Pupils: Pupils are equal, round, and reactive to light.  Neck:     Musculoskeletal: Normal range of motion and neck supple.     Thyroid: No thyromegaly.  Cardiovascular:     Rate and Rhythm: Normal rate and  regular rhythm.     Heart sounds: Normal heart sounds. No murmur. No friction rub. No gallop.   Pulmonary:     Effort: Pulmonary effort is normal. No respiratory distress.     Breath sounds: Normal breath sounds. No stridor. No wheezing or rales.  Chest:    Abdominal:     General: Bowel sounds are normal. There is no distension.     Palpations: Abdomen is soft.     Tenderness: There is no abdominal tenderness. There is no guarding or rebound.  Lymphadenopathy:     Cervical: No cervical adenopathy.  Skin:    General: Skin is warm and dry.     Coloration: Skin is not pale.     Findings: No rash.  Neurological:     Mental Status: She is alert.     Coordination: Coordination normal.      ED Treatments / Results  Labs (all labs ordered are listed, but only abnormal results are displayed) Labs Reviewed - No data to display  EKG None  Radiology No results found.  Procedures Procedures (including critical care time)  Medications Ordered in ED Medications - No data to display   Initial Impression / Assessment and Plan / ED Course  I have reviewed the triage vital signs and the nursing notes.  Pertinent labs & imaging results that were available during my care of the patient were reviewed by me and considered in my medical decision making (see chart for details).        Patient with probable viral URI.  Lungs are clear to auscultation.  No indication for chest x-ray at this time.  Patient is very well-appearing in no acute distress and is afebrile.  Patient would like to continue taking her over-the-counter medications.  Patient advised to return if anything is worsening.  Patient understands and agrees with plan.  Patient vital stable throughout ED course and discharged in satisfactory condition.  Final Clinical Impressions(s) / ED Diagnoses   Final diagnoses:  Viral URI with cough    ED Discharge Orders    None       Emi Holes, PA-C 06/27/18 1740     Melene Plan, DO 06/27/18 1740

## 2018-12-23 ENCOUNTER — Other Ambulatory Visit: Payer: Self-pay

## 2018-12-23 DIAGNOSIS — Z20822 Contact with and (suspected) exposure to covid-19: Secondary | ICD-10-CM

## 2018-12-25 ENCOUNTER — Emergency Department (HOSPITAL_COMMUNITY): Payer: 59

## 2018-12-25 ENCOUNTER — Other Ambulatory Visit: Payer: Self-pay

## 2018-12-25 ENCOUNTER — Emergency Department (HOSPITAL_COMMUNITY)
Admission: EM | Admit: 2018-12-25 | Discharge: 2018-12-25 | Disposition: A | Discharge status: Home / Self Care | Payer: 59 | Attending: Emergency Medicine | Admitting: Emergency Medicine

## 2018-12-25 ENCOUNTER — Encounter (HOSPITAL_COMMUNITY): Payer: Self-pay | Admitting: Emergency Medicine

## 2018-12-25 DIAGNOSIS — Z79899 Other long term (current) drug therapy: Secondary | ICD-10-CM | POA: Insufficient documentation

## 2018-12-25 DIAGNOSIS — J069 Acute upper respiratory infection, unspecified: Secondary | ICD-10-CM | POA: Insufficient documentation

## 2018-12-25 LAB — NOVEL CORONAVIRUS, NAA: SARS-CoV-2, NAA: NOT DETECTED

## 2018-12-25 MED ORDER — ONDANSETRON 4 MG PO TBDP: 4.0000 mg | ORAL_TABLET | Freq: Three times a day (TID) | ORAL | 0 refills | Status: DC | PRN

## 2018-12-25 MED ORDER — NAPROXEN 500 MG PO TABS: 500.0000 mg | ORAL_TABLET | Freq: Two times a day (BID) | ORAL | 0 refills | Status: DC | PRN

## 2018-12-25 MED ORDER — BENZONATATE 100 MG PO CAPS: 100.0000 mg | ORAL_CAPSULE | Freq: Three times a day (TID) | ORAL | 0 refills | Status: DC | PRN

## 2018-12-25 NOTE — ED Provider Notes (Signed)
[facility]-EMERGENCY DEPT Provider Note   CSN: [id number] Arrival date & time: 12/25/18  1147     History   Chief Complaint Chief Complaint  Patient presents with  . Nasal Congestion  . Sore Throat    HPI Brooke Bridges is a 33 y.o. female.     The history is provided by the patient and medical records. No language interpreter was used.   Brooke Bridges is an otherwise healthy 33 y.o. female who presents to the Emergency Department complaining of intermittently productive cough for the last 4 to 5 days.  Associated with nasal congestion, sore throat, nausea and fatigue.  Has a known sick contact with similar symptoms he was tested for coronavirus, but does not have results back.  [facility] but her results are not back either.  She states they told her he would be about 24 to 36 hours, so she should have results soon.  Denies any chest pain or shortness of breath.  No abdominal pain or vomiting.  She had 1 loose stool this morning without any blood.  No medications prior to arrival for her symptoms.  History reviewed. No pertinent past medical history.  There are no active problems to display for this patient.   History reviewed. No pertinent surgical history.   OB History   No obstetric history on file.      Home Medications    Prior to Admission medications   Medication Sig Start Date End Date Taking? Authorizing Provider  benzonatate (TESSALON) 100 MG capsule Take 1 capsule (100 mg total) by mouth 3 (three) times daily as needed for cough. 12/25/18   Esparanza Krider, Chase PicketJaime Pilcher, PA-C  cephALEXin (KEFLEX) 500 MG capsule Take 1 capsule (500 mg total) by mouth 4 (four) times daily. 09/16/14   Gilda CreasePollina, Christopher J, MD  cyclobenzaprine (FLEXERIL) 10 MG tablet Take 1 tablet (10 mg total) by mouth 2 (two) times daily as needed for muscle spasms. 08/29/13   Lurene ShadowPhelps, Erin O, PA-C  meloxicam (MOBIC) 7.5 MG tablet Take 2  tablets (15 mg total) by mouth daily. 08/29/13   Lurene ShadowPhelps, Erin O, PA-C  metroNIDAZOLE (FLAGYL) 500 MG tablet Take 1 tablet (500 mg total) by mouth 2 (two) times daily. One po bid x 7 days 09/17/14   Pisciotta, Joni ReiningNicole, PA-C  naproxen (NAPROSYN) 500 MG tablet Take 1 tablet (500 mg total) by mouth 2 (two) times daily as needed for mild pain or moderate pain. 12/25/18   Sheleen Conchas, Chase PicketJaime Pilcher, PA-C  ondansetron (ZOFRAN ODT) 4 MG disintegrating tablet Take 1 tablet (4 mg total) by mouth every 8 (eight) hours as needed for nausea or vomiting. 12/25/18   Abri Vacca, Chase PicketJaime Pilcher, PA-C  phenazopyridine (PYRIDIUM) 200 MG tablet Take 1 tablet (200 mg total) by mouth 3 (three) times daily as needed for pain. 09/16/14   Gilda CreasePollina, Christopher J, MD    Family History No family history on file.  Social History Social History   Tobacco Use  . Smoking status: Never Smoker  . Smokeless tobacco: Never Used  Substance Use Topics  . Alcohol use: Yes    Comment: occ  . Drug use: No     Allergies   Patient has no known allergies.   Review of Systems Review of Systems  HENT: Positive for congestion and sore throat.   Respiratory: Positive for cough. Negative for shortness of breath and wheezing.   Gastrointestinal: Positive for diarrhea and nausea. Negative for abdominal  pain, constipation and vomiting.  All other systems reviewed and are negative.    Physical Exam Updated Vital Signs BP (!) 159/72   Pulse (!) 59   Temp 98.2 F (36.8 C) (Oral)   Resp 16   LMP 11/27/2018   SpO2 100%   Physical Exam Vitals signs and nursing note reviewed.  Constitutional:      General: She is not in acute distress.    Appearance: She is well-developed.     Comments: Nontoxic-appearing.  HENT:     Head: Normocephalic and atraumatic.     Mouth/Throat:     Comments: Scant erythema.  No tonsillar hypertrophy or exudates. Neck:     Musculoskeletal: Neck supple.  Cardiovascular:     Rate and Rhythm: Normal rate and regular  rhythm.     Heart sounds: Normal heart sounds. No murmur.  Pulmonary:     Effort: Pulmonary effort is normal. No respiratory distress.     Breath sounds: Normal breath sounds.     Comments: Lungs clear to auscultation bilaterally.  Speaking in full sentences without any difficulty. Abdominal:     General: There is no distension.     Palpations: Abdomen is soft.     Comments: No abdominal tenderness.  Skin:    General: Skin is warm and dry.  Neurological:     Mental Status: She is alert and oriented to person, place, and time.      ED Treatments / Results  Labs (all labs ordered are listed, but only abnormal results are displayed) Labs Reviewed - No data to display  EKG None  Radiology Dg Chest Portable 1 View  Result Date: 12/25/2018 CLINICAL DATA:  Acute cough, shortness of breath and congestion. EXAM: PORTABLE CHEST 1 VIEW COMPARISON:  None. FINDINGS: The cardiomediastinal silhouette is unremarkable. There is no evidence of focal airspace disease, pulmonary edema, suspicious pulmonary nodule/mass, pleural effusion, or pneumothorax. No acute bony abnormalities are identified. IMPRESSION: No active disease. Electronically Signed   By: Margarette Canada M.D.   On: 12/25/2018 14:16    Procedures Procedures (including critical care time)  Medications Ordered in ED Medications - No data to display   Initial Impression / Assessment and Plan / ED Course  I have reviewed the triage vital signs and the nursing notes.  Pertinent labs & imaging results that were available during my care of the patient were reviewed by me and considered in my medical decision making (see chart for details).       Brooke Bridges is a 33 y.o. female who presents to ED for cough, congestion, sore throat, nausea and one episode of diarrhea this morning. On exam, patient is afebrile, non-toxic appearing with a clear lung exam.  No abdominal tenderness.  No exudates or tonsillar hypertrophy.  She was tested  for coronavirus yesterday at [facility], but results are not available yet.   CXR without acute findings.   Sxs today likely due to viral URI, covid possible. She already has testing pending.  Symptomatic home care instructions discussed. Rx for symptomatic management given. PCP follow up strongly encouraged if symptoms persist. Reasons to return to ER discussed. All questions answered.     Final Clinical Impressions(s) / ED Diagnoses   Final diagnoses:  Upper respiratory tract infection, unspecified type    ED Discharge Orders         Ordered    ondansetron (ZOFRAN ODT) 4 MG disintegrating tablet  Every 8 hours PRN     12/25/18 1423  benzonatate (TESSALON) 100 MG capsule  3 times daily PRN     12/25/18 1423    naproxen (NAPROSYN) 500 MG tablet  2 times daily PRN     12/25/18 1423           Toia Micale, Chase Picket, PA-C 12/25/18 1431    Melene Plan, DO 12/25/18 1437

## 2018-12-25 NOTE — ED Triage Notes (Signed)
Pt reports since Tuesday had sore throat and nasal congestion. Was tested for Covid yesterday at [facility] but doesn't have results back yet. Reports someone she was around was tested but doesn't know his results but he was given medications for cough and congestion.

## 2018-12-25 NOTE — Discharge Instructions (Signed)
It was my pleasure taking care of you today!   Tessalon as needed for cough, zofran as needed for nausea.  Naproxen as needed for pain.  Flonase and mucinex for nasal congestion, Rest, drink plenty of fluids to be sure you are staying hydrated.   Please follow up with your primary doctor for discussion of your diagnoses and further evaluation after today's visit if symptoms persist longer than 7 days; Return to the ER for high fevers, difficulty breathing or other concerning symptoms.

## 2019-08-11 ENCOUNTER — Encounter (HOSPITAL_COMMUNITY): Payer: Self-pay

## 2019-08-11 ENCOUNTER — Other Ambulatory Visit: Payer: Self-pay

## 2019-08-11 DIAGNOSIS — Z79899 Other long term (current) drug therapy: Secondary | ICD-10-CM | POA: Insufficient documentation

## 2019-08-11 DIAGNOSIS — Z2913 Encounter for prophylactic Rho(D) immune globulin: Secondary | ICD-10-CM | POA: Insufficient documentation

## 2019-08-11 DIAGNOSIS — Z3A08 8 weeks gestation of pregnancy: Secondary | ICD-10-CM | POA: Insufficient documentation

## 2019-08-11 LAB — COMPREHENSIVE METABOLIC PANEL
ALT: 22 U/L (ref 0–44)
AST: 35 U/L (ref 15–41)
Albumin: 4.3 g/dL (ref 3.5–5.0)
Alkaline Phosphatase: 73 U/L (ref 38–126)
Anion gap: 6 (ref 5–15)
BUN: 11 mg/dL (ref 6–20)
CO2: 26 mmol/L (ref 22–32)
Calcium: 9.1 mg/dL (ref 8.9–10.3)
Chloride: 101 mmol/L (ref 98–111)
Creatinine, Ser: 0.77 mg/dL (ref 0.44–1.00)
GFR calc Af Amer: >60 mL/min (ref >60)
GFR calc non Af Amer: >60 mL/min (ref >60)
Glucose, Bld: 92 mg/dL (ref 70–99)
Potassium: 3.8 mmol/L (ref 3.5–5.1)
Sodium: 133 mmol/L — ABNORMAL LOW (ref 135–145)
Total Bilirubin: 0.6 mg/dL (ref 0.3–1.2)
Total Protein: 8.2 g/dL — ABNORMAL HIGH (ref 6.5–8.1)

## 2019-08-11 LAB — CBC WITH DIFFERENTIAL/PLATELET
Abs Immature Granulocytes: 0.02 10*3/uL (ref 0.00–0.07)
Basophils Absolute: 0 10*3/uL (ref 0.0–0.1)
Basophils Relative: 1 %
Eosinophils Absolute: 0.2 10*3/uL (ref 0.0–0.5)
Eosinophils Relative: 3 %
HCT: 36.3 % (ref 36.0–46.0)
Hemoglobin: 11.5 g/dL — ABNORMAL LOW (ref 12.0–15.0)
Immature Granulocytes: 0 %
Lymphocytes Relative: 25 %
Lymphs Abs: 1.4 10*3/uL (ref 0.7–4.0)
MCH: 24.9 pg — ABNORMAL LOW (ref 26.0–34.0)
MCHC: 31.7 g/dL (ref 30.0–36.0)
MCV: 78.6 fL — ABNORMAL LOW (ref 80.0–100.0)
Monocytes Absolute: 0.4 10*3/uL (ref 0.1–1.0)
Monocytes Relative: 8 %
Neutro Abs: 3.6 10*3/uL (ref 1.7–7.7)
Neutrophils Relative %: 63 %
Platelets: 400 10*3/uL (ref 150–400)
RBC: 4.62 MIL/uL (ref 3.87–5.11)
RDW: 16.6 % — ABNORMAL HIGH (ref 11.5–15.5)
WBC: 5.7 10*3/uL (ref 4.0–10.5)
nRBC: 0 % (ref 0.0–0.2)

## 2019-08-11 LAB — I-STAT BETA HCG BLOOD, ED (MC, WL, AP ONLY): I-stat hCG, quantitative: 210.2 m[IU]/mL — ABNORMAL HIGH (ref <5)

## 2019-08-11 NOTE — ED Triage Notes (Signed)
Pt sts she had an abortion at [redacted]wk pregnant. Took the pill 4/5 and 4/6. Yesterday noticed mild bleeding with some clots. Sts she has went through one pad.

## 2019-08-11 NOTE — ED Notes (Signed)
2 gold tops in lab

## 2019-08-12 ENCOUNTER — Emergency Department (HOSPITAL_COMMUNITY)
Admission: EM | Admit: 2019-08-12 | Discharge: 2019-08-12 | Disposition: A | Discharge status: Home / Self Care | Payer: Self-pay | Attending: Emergency Medicine | Admitting: Emergency Medicine

## 2019-08-12 ENCOUNTER — Emergency Department (HOSPITAL_COMMUNITY): Payer: Self-pay

## 2019-08-12 DIAGNOSIS — O034 Incomplete spontaneous abortion without complication: Secondary | ICD-10-CM

## 2019-08-12 MED ORDER — RHO D IMMUNE GLOBULIN 1500 UNIT/2ML IJ SOSY
300.0000 ug | PREFILLED_SYRINGE | Freq: Once | INTRAMUSCULAR | Status: AC
  Administered 2019-08-12: 300 ug via INTRAMUSCULAR
  Filled 2019-08-12: qty 2

## 2019-08-12 MED ORDER — MISOPROSTOL 200 MCG PO TABS
800.0000 ug | ORAL_TABLET | Freq: Once | ORAL | 0 refills | Status: DC
Start: 2019-08-12 — End: 2021-11-06

## 2019-08-12 NOTE — ED Provider Notes (Signed)
[facility] DEPT Provider Note   CSN: [id number] Arrival date & time: 08/11/19  2209     History Chief Complaint  Patient presents with  . Vaginal Bleeding    Brooke Bridges is a 34 y.o. female.  The history is provided by the patient and medical records.  Vaginal Bleeding Associated symptoms: abdominal pain (cramping)     34 y.o. G1P0 s/p abortion with cytotec on 4/5 and 4/6 at [redacted] weeks gestation, presenting to the ED for vaginal bleeding.  Patient reports after her abortion, she had intense cramping and passage of clots/blood which stopped after a few days.  States yesterday she started bleeding again with recurrent passage of clots (largest was about size of a lemon).  She has had some cramping and sensation of bloating/swelling in her lower abdomen.  She has only used 1 pad today.  No dizziness/lightheadedness.  No fever/chills. No urinary symptoms.  Denies vaginal discharge.  She has been sexually active since abortion earlier this month.  She is not currently established with GYN.  History reviewed. No pertinent past medical history.  There are no problems to display for this patient.   History reviewed. No pertinent surgical history.   OB History   No obstetric history on file.     No family history on file.  Social History   Tobacco Use  . Smoking status: Never Smoker  . Smokeless tobacco: Never Used  Substance Use Topics  . Alcohol use: Yes    Comment: occ  . Drug use: No    Home Medications Prior to Admission medications   Medication Sig Start Date End Date Taking? Authorizing Provider  benzonatate (TESSALON) 100 MG capsule Take 1 capsule (100 mg total) by mouth 3 (three) times daily as needed for cough. 12/25/18   Ward, Ozella Almond, PA-C  cephALEXin (KEFLEX) 500 MG capsule Take 1 capsule (500 mg total) by mouth 4 (four) times daily. 09/16/14   Orpah Greek, MD  cyclobenzaprine (FLEXERIL) 10 MG tablet Take 1 tablet  (10 mg total) by mouth 2 (two) times daily as needed for muscle spasms. 08/29/13   Noe Gens, PA-C  meloxicam (MOBIC) 7.5 MG tablet Take 2 tablets (15 mg total) by mouth daily. 08/29/13   Noe Gens, PA-C  metroNIDAZOLE (FLAGYL) 500 MG tablet Take 1 tablet (500 mg total) by mouth 2 (two) times daily. One po bid x 7 days 09/17/14   Pisciotta, Elmyra Ricks, PA-C  naproxen (NAPROSYN) 500 MG tablet Take 1 tablet (500 mg total) by mouth 2 (two) times daily as needed for mild pain or moderate pain. 12/25/18   Ward, Ozella Almond, PA-C  ondansetron (ZOFRAN ODT) 4 MG disintegrating tablet Take 1 tablet (4 mg total) by mouth every 8 (eight) hours as needed for nausea or vomiting. 12/25/18   Ward, Ozella Almond, PA-C  phenazopyridine (PYRIDIUM) 200 MG tablet Take 1 tablet (200 mg total) by mouth 3 (three) times daily as needed for pain. 09/16/14   Orpah Greek, MD    Allergies    Patient has no known allergies.  Review of Systems   Review of Systems  Gastrointestinal: Positive for abdominal pain (cramping).  Genitourinary: Positive for vaginal bleeding.  All other systems reviewed and are negative.   Physical Exam Updated Vital Signs BP 138/81 (BP Location: Left Arm)   Pulse 86   Temp 98.3 F (36.8 C)   Resp 16   Ht 5\' 1"  (1.549 m)   Wt 68 kg  SpO2 100%   BMI 28.34 kg/m   Physical Exam Vitals and nursing note reviewed.  Constitutional:      Appearance: She is well-developed.  HENT:     Head: Normocephalic and atraumatic.  Eyes:     Conjunctiva/sclera: Conjunctivae normal.     Pupils: Pupils are equal, round, and reactive to light.  Cardiovascular:     Rate and Rhythm: Normal rate and regular rhythm.     Heart sounds: Normal heart sounds.  Pulmonary:     Effort: Pulmonary effort is normal. No respiratory distress.     Breath sounds: Normal breath sounds. No rhonchi.  Abdominal:     General: Bowel sounds are normal.     Palpations: Abdomen is soft.  Musculoskeletal:         General: Normal range of motion.     Cervical back: Normal range of motion.  Skin:    General: Skin is warm and dry.  Neurological:     Mental Status: She is alert and oriented to person, place, and time.     ED Results / Procedures / Treatments   Labs (all labs ordered are listed, but only abnormal results are displayed) Labs Reviewed  CBC WITH DIFFERENTIAL/PLATELET - Abnormal; Notable for the following components:      Result Value   Hemoglobin 11.5 (*)    MCV 78.6 (*)    MCH 24.9 (*)    RDW 16.6 (*)    All other components within normal limits  COMPREHENSIVE METABOLIC PANEL - Abnormal; Notable for the following components:   Sodium 133 (*)    Total Protein 8.2 (*)    All other components within normal limits  I-STAT BETA HCG BLOOD, ED ([facility], [facility], [facility] ONLY) - Abnormal; Notable for the following components:   I-stat hCG, quantitative 210.2 (*)    All other components within normal limits  RH IG WORKUP (INCLUDES ABO/RH)    EKG None  Radiology US OB Comp Less 14 Wks  Result Date: 08/12/2019 CLINICAL DATA:  Bleeding after an abortion EXAM: TRANSVAGINAL OB ULTRASOUND; OBSTETRIC <14 WK ULTRASOUND TECHNIQUE: Transvaginal ultrasound was performed for complete evaluation of the gestation as well as the maternal uterus, adnexal regions, and pelvic cul-de-sac. COMPARISON:  None. FINDINGS: Intrauterine gestational sac: None Subchorionic hemorrhage:  None visualized. Maternal uterus/adnexae: There is a fibroid uterus seen along the posterior left uterine fundus measuring 3.1 x 3.1 x 3.1 cm. There is a large amount of echogenic heterogeneous hypervascular material seen within the endometrial canal. The endometrium measures up to 2.7 cm. The uterus measures 8.6 x 5.2 x 7.7 cm with a volume of 182 mL. The right ovary measures 4.2 x 2.7 x 2.8 cm with a volume of 16.5 mL and a corpus luteum cyst present. The left ovary measures 4.1 x 1.9 x 3.3 cm with a volume of 13.7 mL. There is trace free fluid  within the cul-de-sac. IMPRESSION: Findings suggestive of a large amount of retained products of conception within the endometrial canal. Electronically Signed   By: Jonna Clark M.D.   On: 08/12/2019 03:52   US OB Transvaginal  Result Date: 08/12/2019 CLINICAL DATA:  Bleeding after an abortion EXAM: TRANSVAGINAL OB ULTRASOUND; OBSTETRIC <14 WK ULTRASOUND TECHNIQUE: Transvaginal ultrasound was performed for complete evaluation of the gestation as well as the maternal uterus, adnexal regions, and pelvic cul-de-sac. COMPARISON:  None. FINDINGS: Intrauterine gestational sac: None Subchorionic hemorrhage:  None visualized. Maternal uterus/adnexae: There is a fibroid uterus seen along the posterior left  uterine fundus measuring 3.1 x 3.1 x 3.1 cm. There is a large amount of echogenic heterogeneous hypervascular material seen within the endometrial canal. The endometrium measures up to 2.7 cm. The uterus measures 8.6 x 5.2 x 7.7 cm with a volume of 182 mL. The right ovary measures 4.2 x 2.7 x 2.8 cm with a volume of 16.5 mL and a corpus luteum cyst present. The left ovary measures 4.1 x 1.9 x 3.3 cm with a volume of 13.7 mL. There is trace free fluid within the cul-de-sac. IMPRESSION: Findings suggestive of a large amount of retained products of conception within the endometrial canal. Electronically Signed   By: Jonna Clark M.D.   On: 08/12/2019 03:52    Procedures Procedures (including critical care time)  Medications Ordered in ED Medications - No data to display  ED Course  I have reviewed the triage vital signs and the nursing notes.  Pertinent labs & imaging results that were available during my care of the patient were reviewed by me and considered in my medical decision making (see chart for details).    MDM Rules/Calculators/A&P  34 y.o.  G1P0 s/p abortion on 4/5 with Cytotec at [redacted] weeks gestation, here with vaginal bleeding.  States immediately after the medication, symptoms took usual  course that she was told about.  Vaginal bleeding stopped and returned yesterday. States recurrent passage of clots, largest size of a lemon.  Has only used 1 pad in 24 hours.  She is afebrile, non-toxic.  Abdomen overall soft and non-tender.  She does report some lower abdominal cramping and bloating/swelling sensation.  Labs today with hcg of 210.  Suspect this is down trending but unsure what her numbers were initially.  She has been sexually active since her abortion as well.  Hemoglobin is stable.  Will obtain US to r/o retained products or other acute findings.  ABO/Rh pending.  4:16 AM [place] does reveal large amount of retained products.  Discussed with on call OB-GYN, Dr. Cristina Gong-- as patient is hemodynamically stable with essentially normal hemoglobin, recommends repeat cytotec 800mg  orally, repeat in 24 hours if needed.  Will need close follow-up in clinic.    Results discussed with patient.  She is agreeable to repeat dosing of cytotec.  She is aware of expected symptoms after taking this (cramping, bleeding, etc).   She is O neg, given dose of rhogam here.   She will arrange follow-up in women's clinic.  Return here for any new/acute changes-- severe pain, excessive bleeding, fevers, lightheadedness, syncope, etc.  Final Clinical Impression(s) / ED Diagnoses Final diagnoses:  Retained products of conception following abortion    Rx / DC Orders ED Discharge Orders         Ordered    misoprostol (CYTOTEC) 200 MCG tablet   Once     08/12/19 0456           08/14/19, PA-C 08/12/19 08/14/19    [id number], MD 08/12/19 [phone]

## 2019-08-12 NOTE — Discharge Instructions (Addendum)
Take the prescribed medication as directed.   Follow-up with the women's outpatient clinic-- I would go ahead and call this morning to get an appt scheduled for follow-up in about 5-7 days. Return to the ED for new or worsening symptoms.

## 2019-08-13 LAB — RH IG WORKUP (INCLUDES ABO/RH)
ABO/RH(D): O NEG
Antibody Screen: NEGATIVE
Gestational Age(Wks): 0
Unit division: 0
Unit tag comment: 0

## 2020-07-13 HISTORY — PX: WISDOM TOOTH EXTRACTION: SHX21

## 2020-07-31 DIAGNOSIS — D259 Leiomyoma of uterus, unspecified: Secondary | ICD-10-CM | POA: Diagnosis not present

## 2020-07-31 DIAGNOSIS — D251 Intramural leiomyoma of uterus: Secondary | ICD-10-CM | POA: Diagnosis not present

## 2020-07-31 DIAGNOSIS — N838 Other noninflammatory disorders of ovary, fallopian tube and broad ligament: Secondary | ICD-10-CM | POA: Diagnosis not present

## 2020-10-04 IMAGING — DX DG CHEST 1V PORT
1 series · 1 of 1 positions shown · non-contrast
Comparison: None.

CLINICAL DATA: Acute cough, shortness of breath and congestion.

EXAM:
PORTABLE CHEST 1 VIEW

[chest ap]
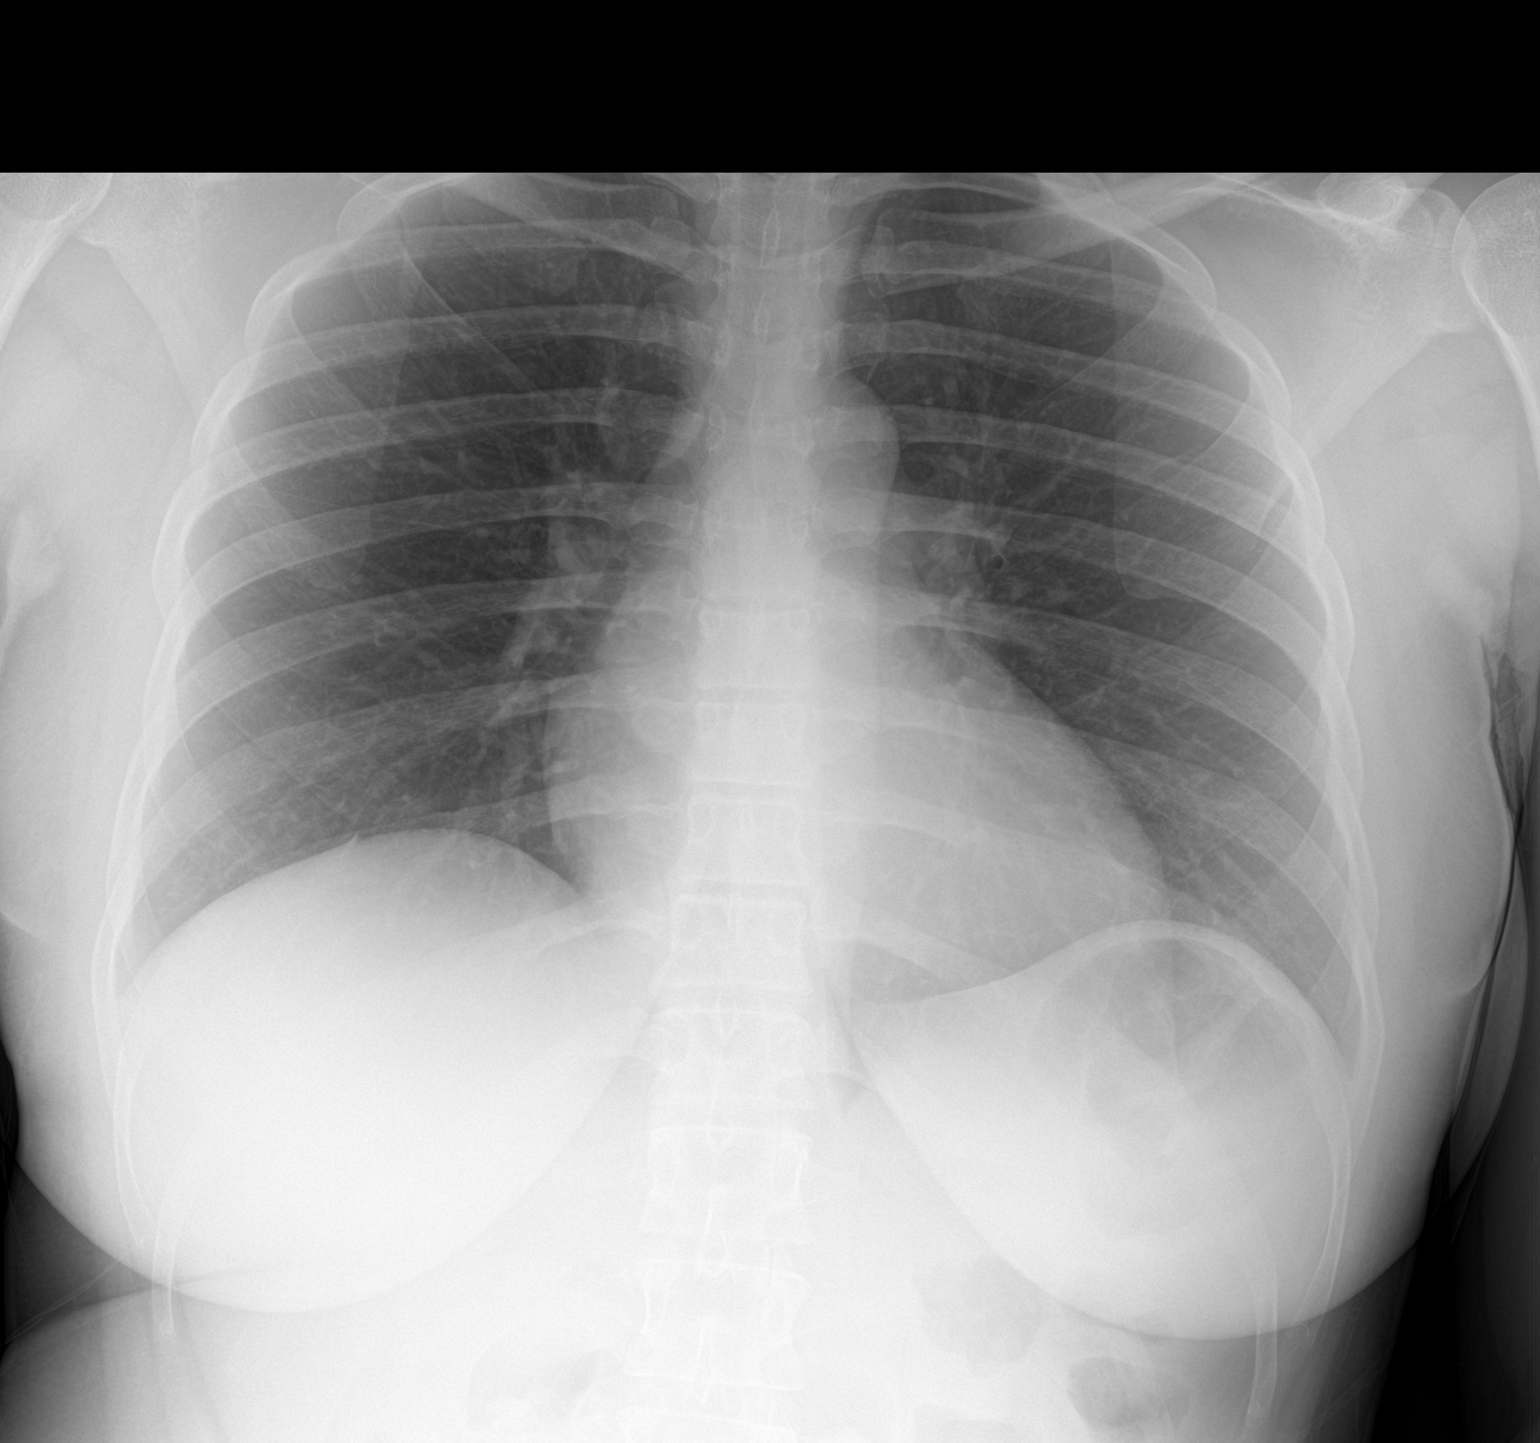

[1 of 1 positions shown; findings below may reference images not displayed]

FINDINGS: The cardiomediastinal silhouette is unremarkable.

There is no evidence of focal airspace disease, pulmonary edema,
suspicious pulmonary nodule/mass, pleural effusion, or pneumothorax.

No acute bony abnormalities are identified.
IMPRESSION: No active disease.

## 2020-10-08 DIAGNOSIS — Z86018 Personal history of other benign neoplasm: Secondary | ICD-10-CM | POA: Diagnosis not present

## 2020-10-08 DIAGNOSIS — Z862 Personal history of diseases of the blood and blood-forming organs and certain disorders involving the immune mechanism: Secondary | ICD-10-CM | POA: Diagnosis not present

## 2020-10-08 DIAGNOSIS — Z131 Encounter for screening for diabetes mellitus: Secondary | ICD-10-CM | POA: Diagnosis not present

## 2020-10-08 DIAGNOSIS — Z0000 Encounter for general adult medical examination without abnormal findings: Secondary | ICD-10-CM | POA: Diagnosis not present

## 2021-05-22 IMAGING — US US OB COMP LESS 14 WK
1 series · 14 of 28 positions shown · non-contrast
Comparison: None.

CLINICAL DATA: Bleeding after an abortion

EXAM:
TRANSVAGINAL OB ULTRASOUND; OBSTETRIC <14 WK ULTRASOUND
TECHNIQUE: Transvaginal ultrasound was performed for complete evaluation of the
gestation as well as the maternal uterus, adnexal regions, and
pelvic cul-de-sac.

[Series 1: us ob comp less 14 wk · 59 acquisitions, 14 frames shown]
[im 3/59]
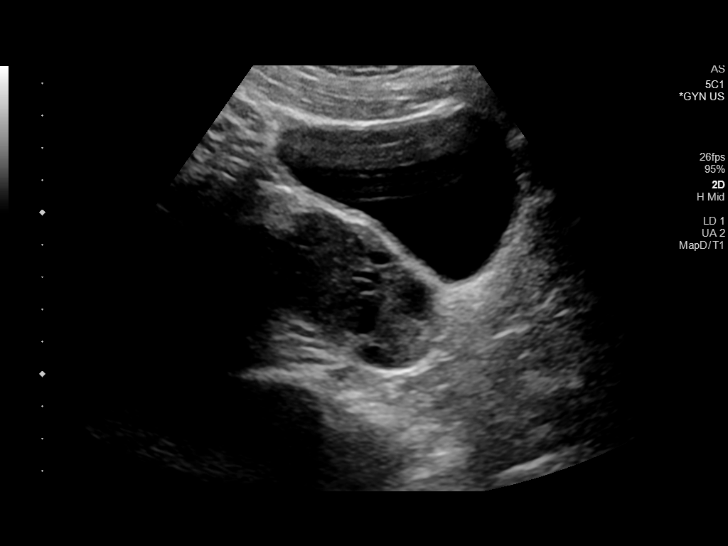
[im 7/59]
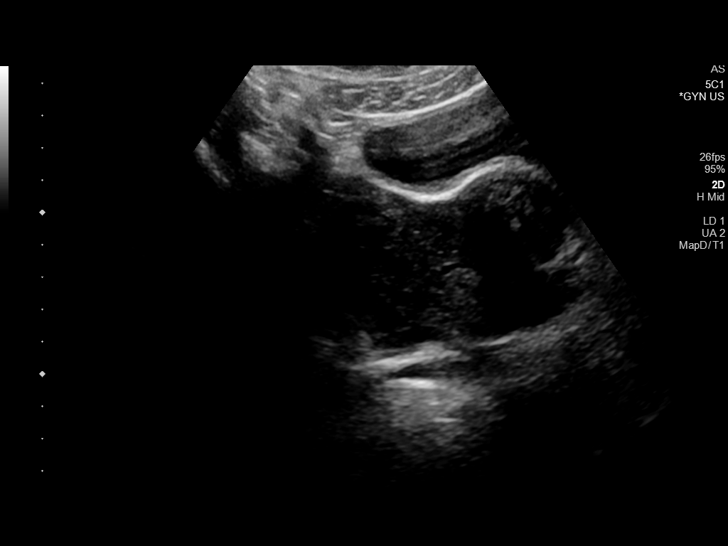
[im 11/59]
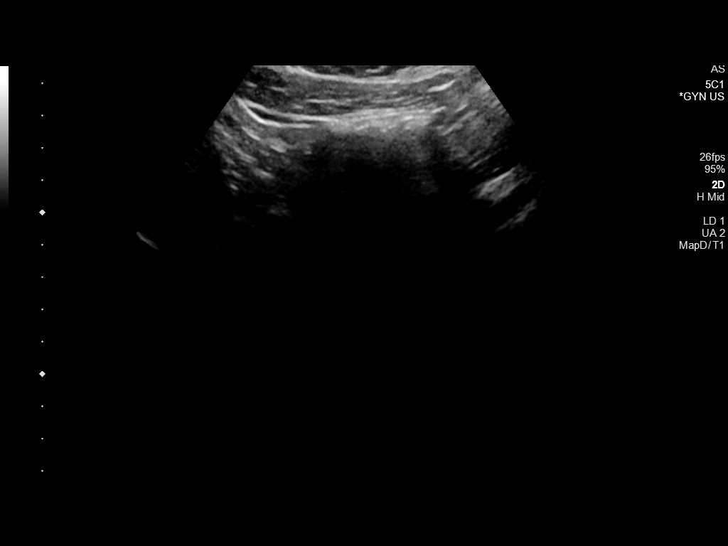
[im 16/59]
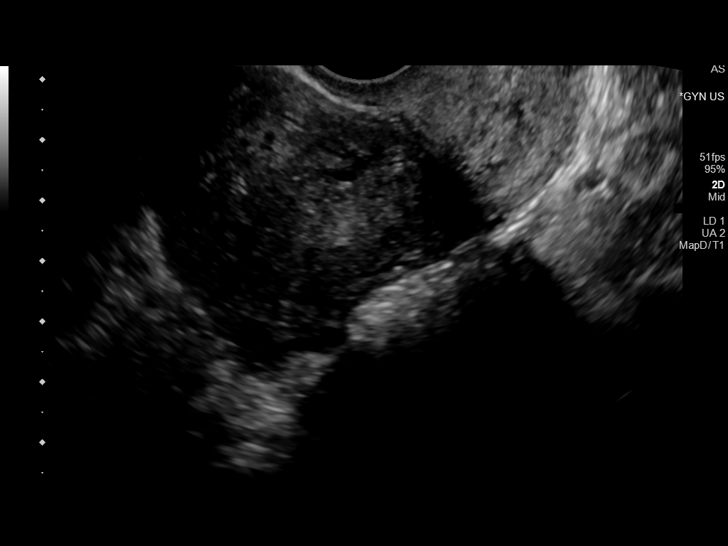
[im 20/59]
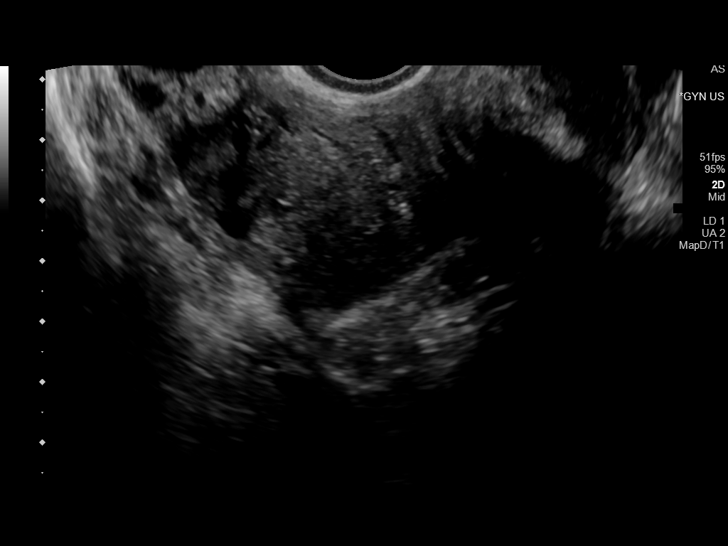
[im 24/59]
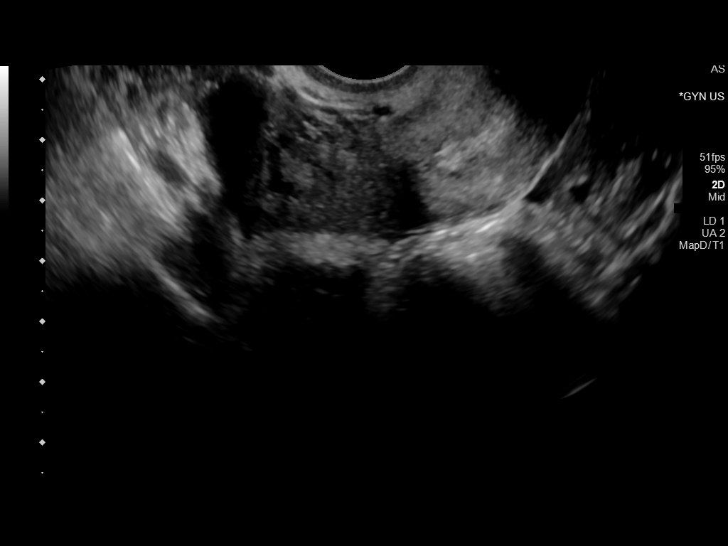
[im 28/59]
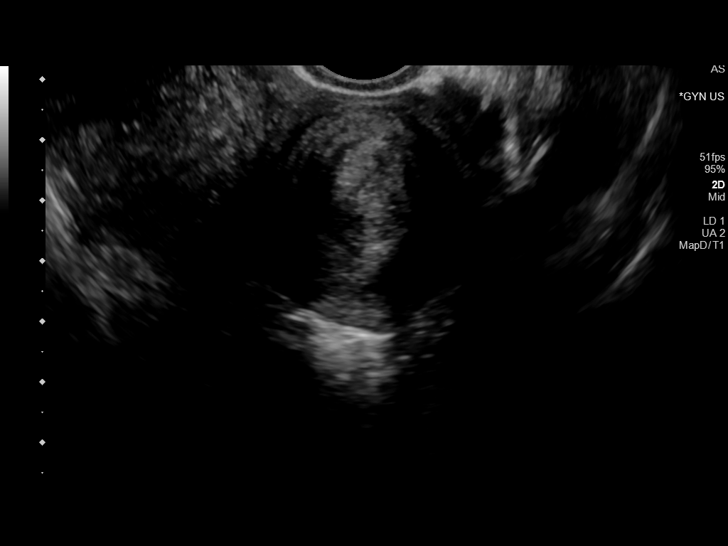
[im 33/59]
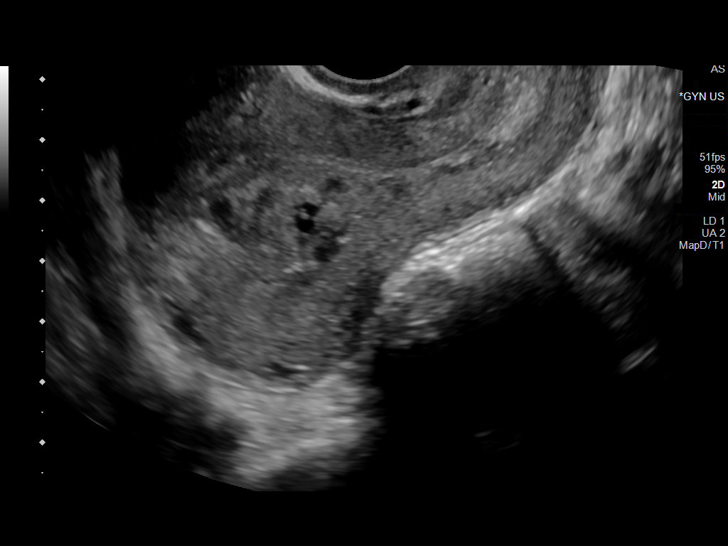
[im 37/59]
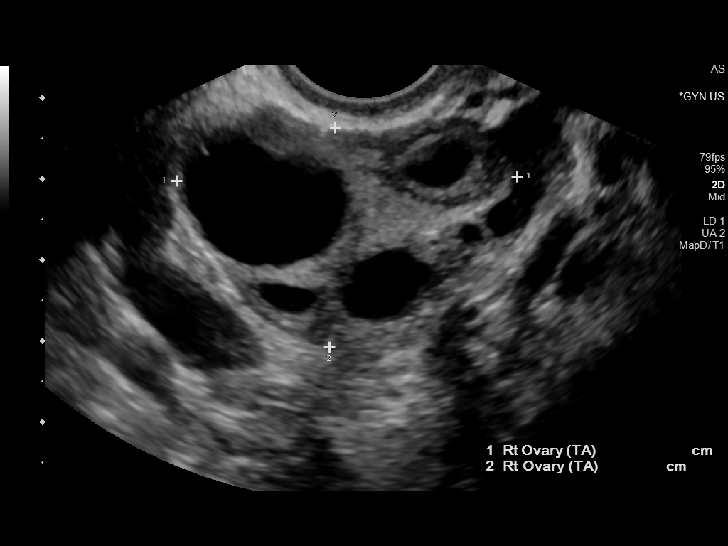
[im 41/59]
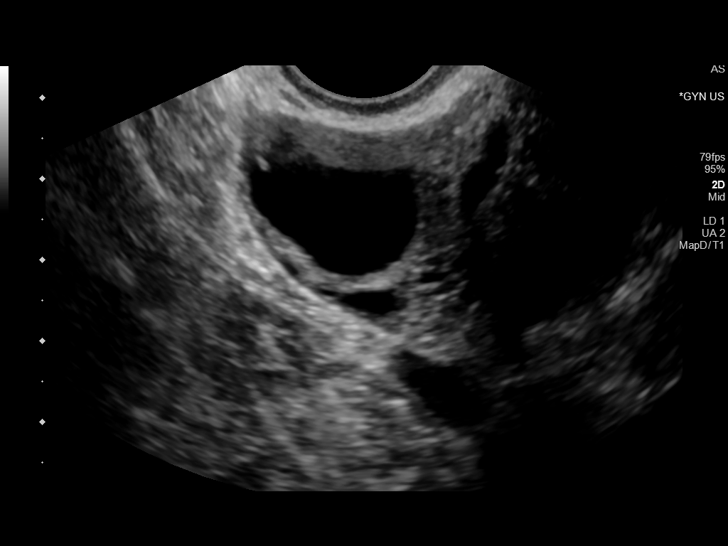
[im 46/59]
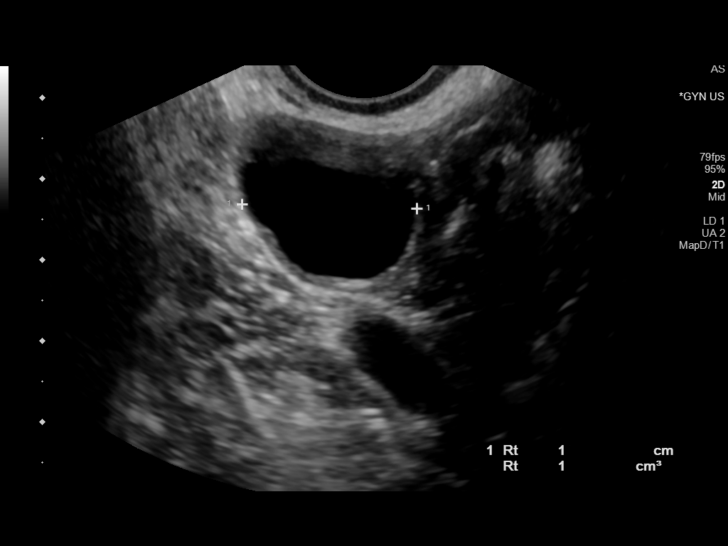
[im 50/59]
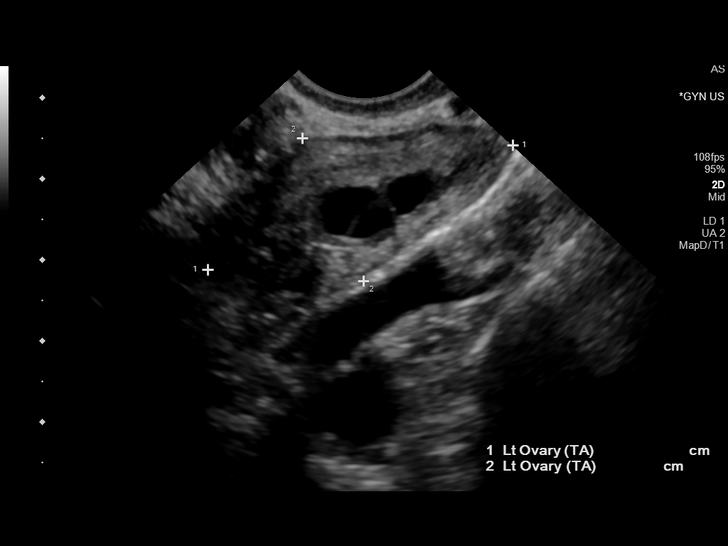
[im 54/59]
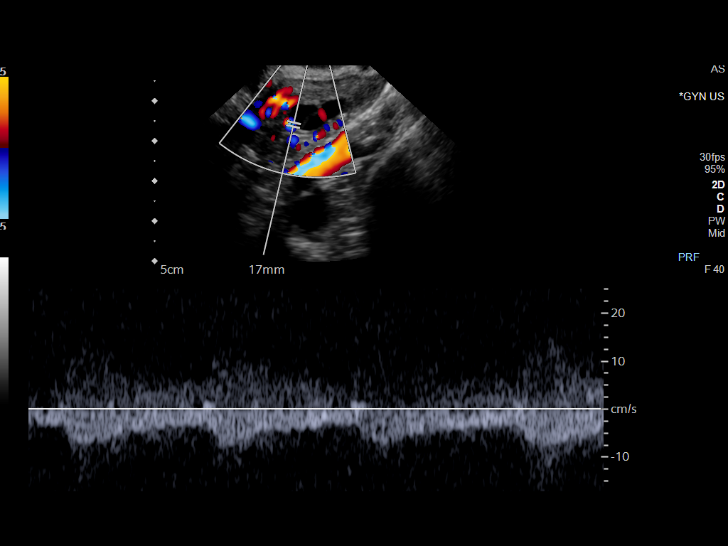
[im 59/59]
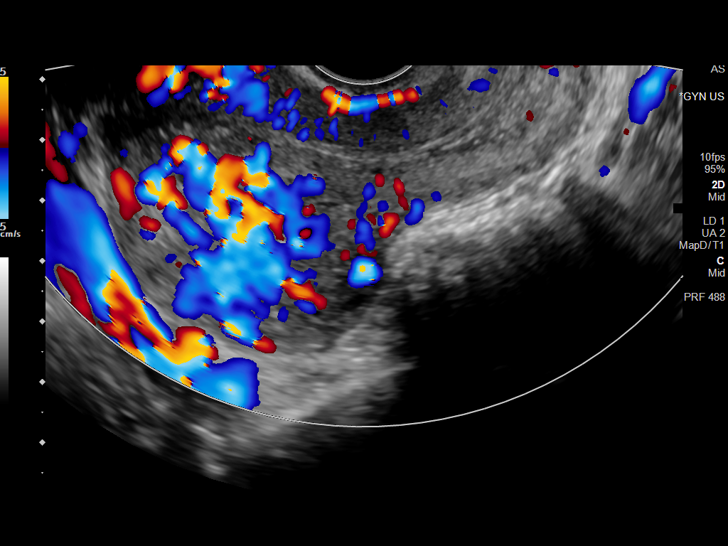

[14 of 28 positions shown; findings below may reference images not displayed]

FINDINGS: Intrauterine gestational sac: None

Subchorionic hemorrhage:  None visualized.

Maternal uterus/adnexae: There is a fibroid uterus seen along the
posterior left uterine fundus measuring 3.1 x 3.1 x 3.1 cm. There is
a large amount of echogenic heterogeneous hypervascular material
seen within the endometrial canal. The endometrium measures up to
2.7 cm. The uterus measures 8.6 x 5.2 x 7.7 cm with a volume of 182
mL. The right ovary measures 4.2 x 2.7 x 2.8 cm with a volume of
16.5 mL and a corpus luteum cyst present. The left ovary measures
4.1 x 1.9 x 3.3 cm with a volume of 13.7 mL. There is trace free
fluid within the cul-de-sac.
IMPRESSION: Findings suggestive of a large amount of retained products of
conception within the endometrial canal.

## 2021-05-22 IMAGING — US US OB TRANSVAGINAL
1 series · 14 of 28 positions shown · non-contrast
Comparison: None.

CLINICAL DATA: Bleeding after an abortion

EXAM:
TRANSVAGINAL OB ULTRASOUND; OBSTETRIC <14 WK ULTRASOUND
TECHNIQUE: Transvaginal ultrasound was performed for complete evaluation of the
gestation as well as the maternal uterus, adnexal regions, and
pelvic cul-de-sac.

[Series 1: us ob transvaginal · 14 of 56 slices shown]
[im 3/56]
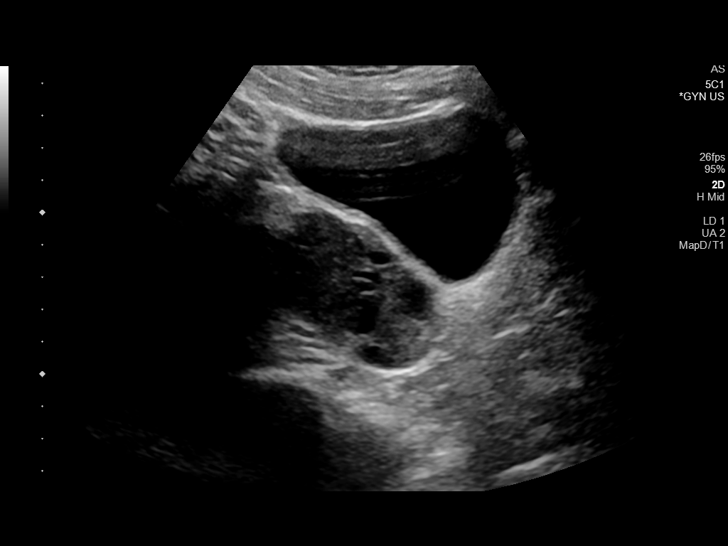
[im 7/56]
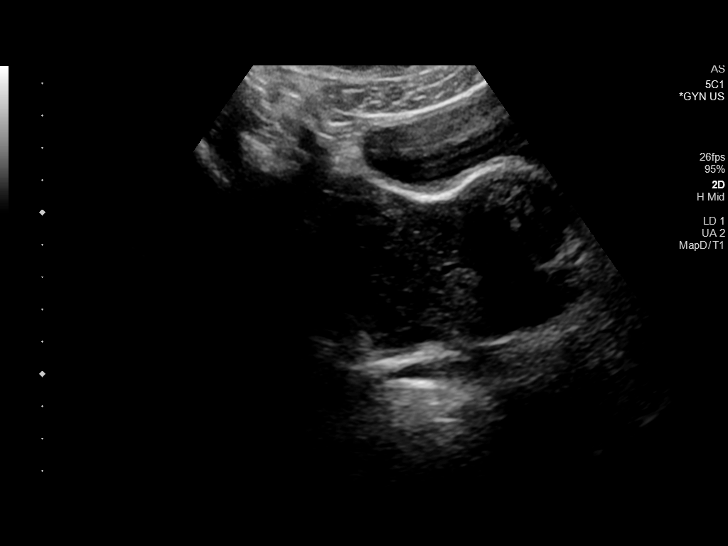
[im 11/56]
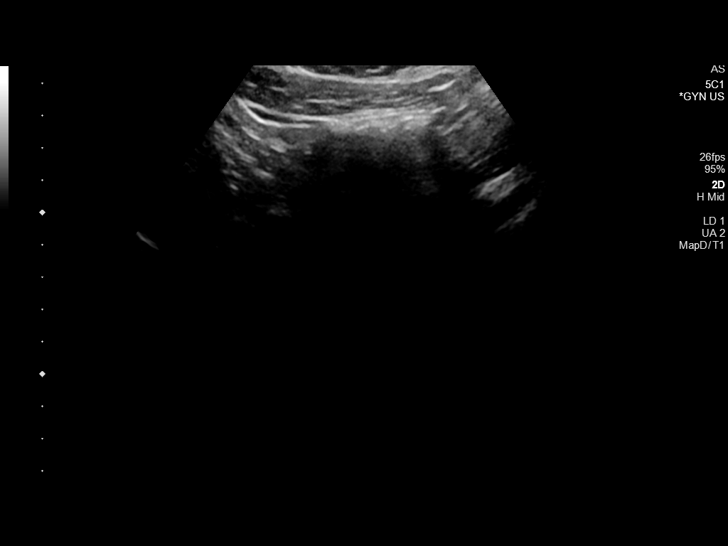
[im 15/56]
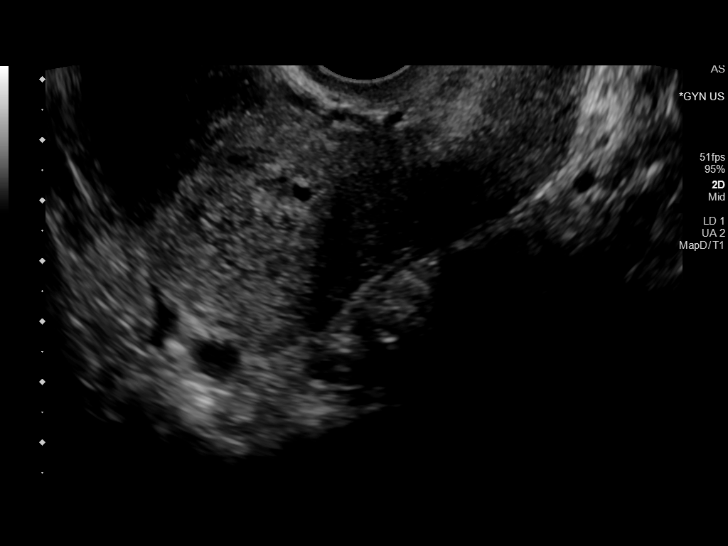
[im 19/56]
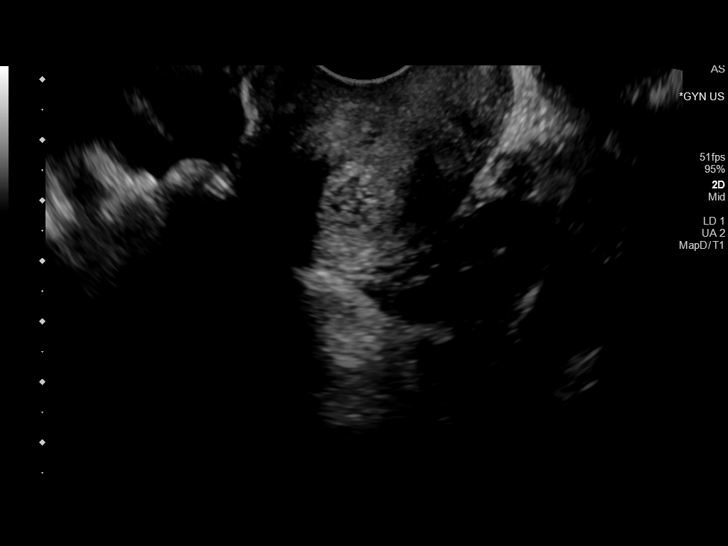
[im 23/56]
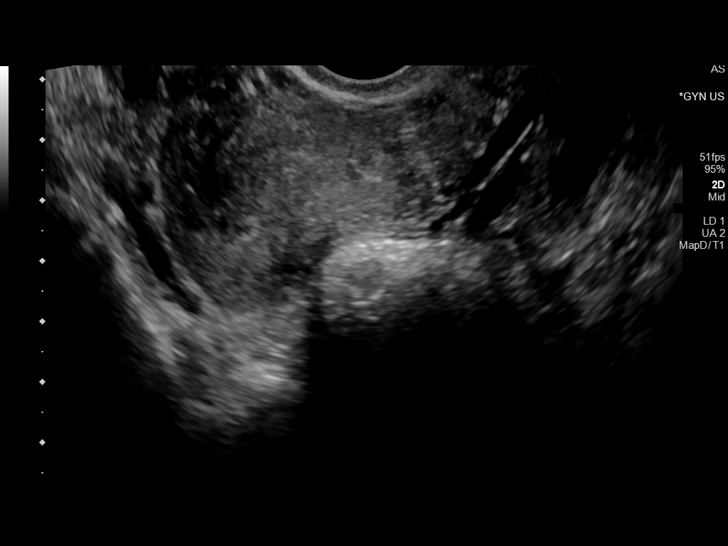
[im 27/56]
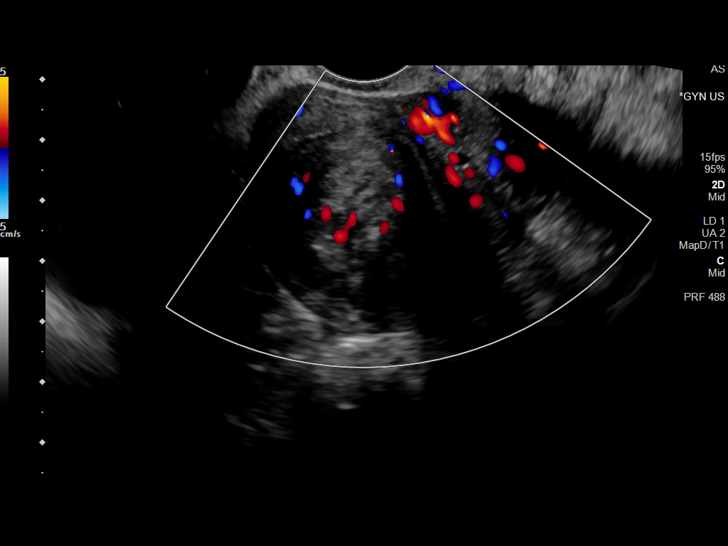
[im 31/56]
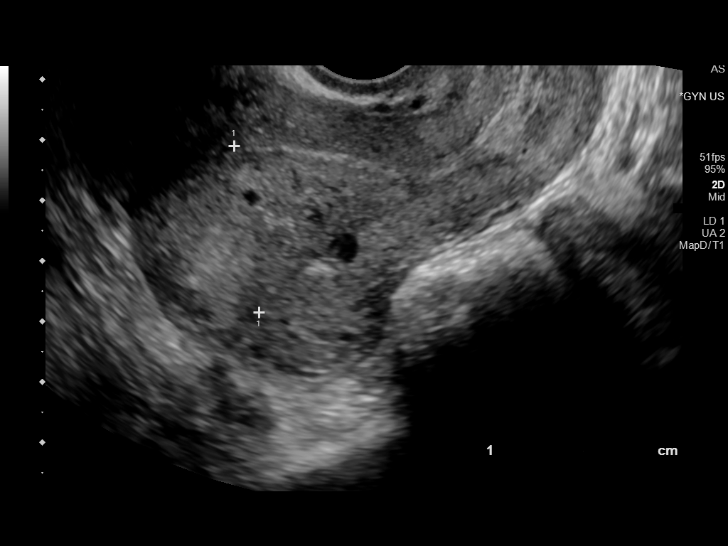
[im 35/56]
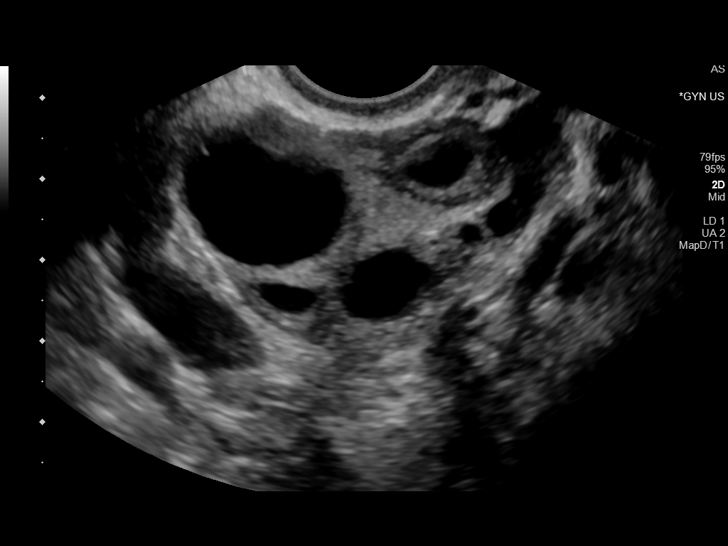
[im 39/56]
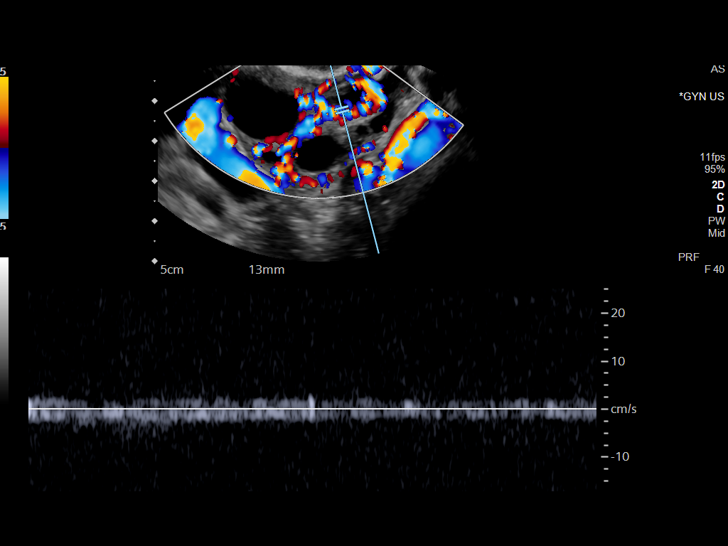
[im 43/56]
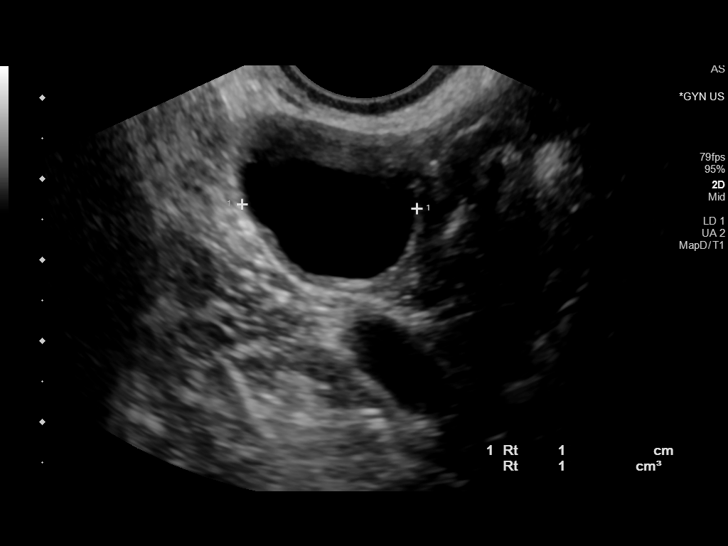
[im 47/56]
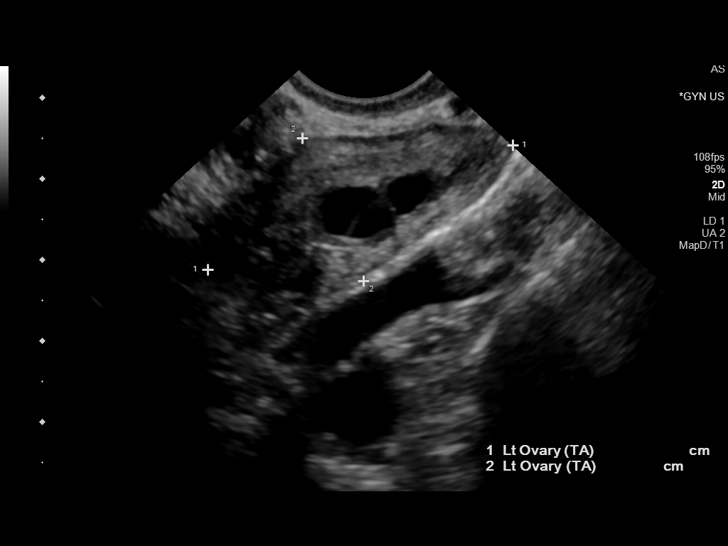
[im 51/56]
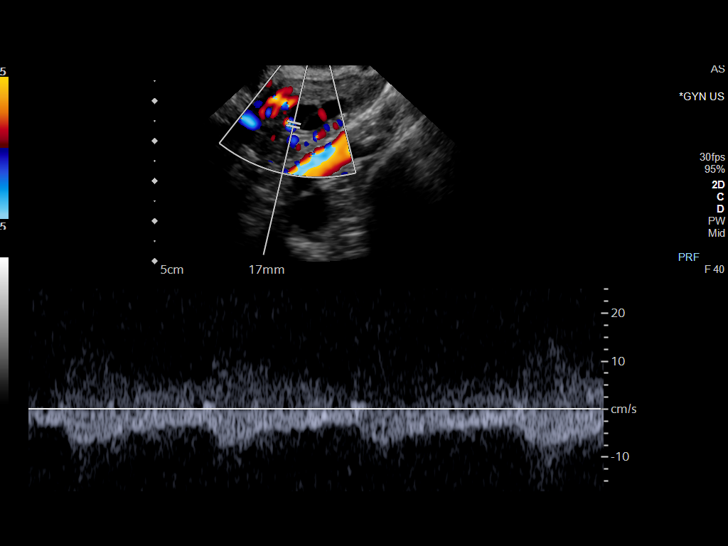
[im 56/56]
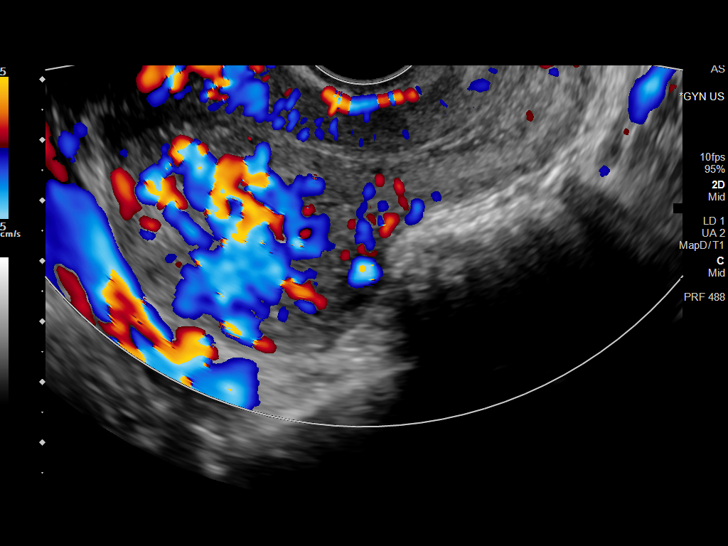

[14 of 28 positions shown; findings below may reference images not displayed]

FINDINGS: Intrauterine gestational sac: None

Subchorionic hemorrhage:  None visualized.

Maternal uterus/adnexae: There is a fibroid uterus seen along the
posterior left uterine fundus measuring 3.1 x 3.1 x 3.1 cm. There is
a large amount of echogenic heterogeneous hypervascular material
seen within the endometrial canal. The endometrium measures up to
2.7 cm. The uterus measures 8.6 x 5.2 x 7.7 cm with a volume of 182
mL. The right ovary measures 4.2 x 2.7 x 2.8 cm with a volume of
16.5 mL and a corpus luteum cyst present. The left ovary measures
4.1 x 1.9 x 3.3 cm with a volume of 13.7 mL. There is trace free
fluid within the cul-de-sac.
IMPRESSION: Findings suggestive of a large amount of retained products of
conception within the endometrial canal.

## 2021-06-19 DIAGNOSIS — K6289 Other specified diseases of anus and rectum: Secondary | ICD-10-CM | POA: Diagnosis not present

## 2021-07-01 ENCOUNTER — Telehealth: Payer: Self-pay | Admitting: General Practice

## 2021-07-01 NOTE — Telephone Encounter (Signed)
Pt scheduled new patient appointment online.  Left message on VM for patient to contact our office to verify insurance & to discuss new patient information. ?

## 2021-08-21 DIAGNOSIS — F4322 Adjustment disorder with anxiety: Secondary | ICD-10-CM | POA: Diagnosis not present

## 2021-08-28 DIAGNOSIS — F4322 Adjustment disorder with anxiety: Secondary | ICD-10-CM | POA: Diagnosis not present

## 2021-09-04 DIAGNOSIS — F4322 Adjustment disorder with anxiety: Secondary | ICD-10-CM | POA: Diagnosis not present

## 2021-09-10 DIAGNOSIS — R112 Nausea with vomiting, unspecified: Secondary | ICD-10-CM | POA: Diagnosis not present

## 2021-09-10 DIAGNOSIS — U071 COVID-19: Secondary | ICD-10-CM | POA: Diagnosis not present

## 2021-09-10 DIAGNOSIS — B349 Viral infection, unspecified: Secondary | ICD-10-CM | POA: Diagnosis not present

## 2021-09-13 ENCOUNTER — Encounter: Payer: Self-pay | Admitting: General Practice

## 2021-09-16 ENCOUNTER — Encounter: Payer: Self-pay | Admitting: Family Medicine

## 2021-09-16 ENCOUNTER — Encounter: Payer: Self-pay | Admitting: General Practice

## 2021-09-16 NOTE — Progress Notes (Signed)
Patient did not keep appointment today. She may call to reschedule.  

## 2021-10-01 ENCOUNTER — Encounter: Payer: Self-pay | Admitting: General Practice

## 2021-10-02 DIAGNOSIS — Z3400 Encounter for supervision of normal first pregnancy, unspecified trimester: Secondary | ICD-10-CM | POA: Diagnosis not present

## 2021-10-02 DIAGNOSIS — Z3201 Encounter for pregnancy test, result positive: Secondary | ICD-10-CM | POA: Diagnosis not present

## 2021-10-02 DIAGNOSIS — Z3689 Encounter for other specified antenatal screening: Secondary | ICD-10-CM | POA: Diagnosis not present

## 2021-10-02 DIAGNOSIS — O09529 Supervision of elderly multigravida, unspecified trimester: Secondary | ICD-10-CM | POA: Diagnosis not present

## 2021-10-02 DIAGNOSIS — Z3A13 13 weeks gestation of pregnancy: Secondary | ICD-10-CM | POA: Diagnosis not present

## 2021-10-02 DIAGNOSIS — O3680X0 Pregnancy with inconclusive fetal viability, not applicable or unspecified: Secondary | ICD-10-CM | POA: Diagnosis not present

## 2021-10-02 DIAGNOSIS — Z124 Encounter for screening for malignant neoplasm of cervix: Secondary | ICD-10-CM | POA: Diagnosis not present

## 2021-10-02 DIAGNOSIS — Z369 Encounter for antenatal screening, unspecified: Secondary | ICD-10-CM | POA: Diagnosis not present

## 2021-10-02 DIAGNOSIS — Z113 Encounter for screening for infections with a predominantly sexual mode of transmission: Secondary | ICD-10-CM | POA: Diagnosis not present

## 2021-10-02 LAB — HM HEPATITIS C SCREENING LAB: HM Hepatitis Screen: NEGATIVE

## 2021-10-02 LAB — HM PAP SMEAR

## 2021-10-02 LAB — HEPATITIS B SURFACE ANTIGEN: Hepatitis B Surface Ag: NEGATIVE

## 2021-10-02 LAB — RESULTS CONSOLE HPV: CHL HPV: NEGATIVE

## 2021-10-02 LAB — OB RESULTS CONSOLE GC/CHLAMYDIA: Chlamydia: NEGATIVE

## 2021-10-02 LAB — HM HIV SCREENING LAB: HM HIV Screening: NEGATIVE

## 2021-10-02 LAB — HIV ANTIBODY (ROUTINE TESTING W REFLEX): HIV 1&2 Ab, 4th Generation: NONREACTIVE

## 2021-10-03 DIAGNOSIS — F4322 Adjustment disorder with anxiety: Secondary | ICD-10-CM | POA: Diagnosis not present

## 2021-10-09 DIAGNOSIS — F4322 Adjustment disorder with anxiety: Secondary | ICD-10-CM | POA: Diagnosis not present

## 2021-10-10 DIAGNOSIS — D573 Sickle-cell trait: Secondary | ICD-10-CM | POA: Insufficient documentation

## 2021-10-22 ENCOUNTER — Ambulatory Visit: Payer: Self-pay | Admitting: Physician Assistant

## 2021-10-23 DIAGNOSIS — F4322 Adjustment disorder with anxiety: Secondary | ICD-10-CM | POA: Diagnosis not present

## 2021-10-30 DIAGNOSIS — F4322 Adjustment disorder with anxiety: Secondary | ICD-10-CM | POA: Diagnosis not present

## 2021-11-06 ENCOUNTER — Ambulatory Visit (INDEPENDENT_AMBULATORY_CARE_PROVIDER_SITE_OTHER): Payer: Federal, State, Local not specified - PPO | Admitting: Physician Assistant

## 2021-11-06 ENCOUNTER — Encounter: Payer: Self-pay | Admitting: Physician Assistant

## 2021-11-06 VITALS — BP 100/60 | HR 76 | Temp 97.9°F | Ht 61.0 in | Wt 135.0 lb

## 2021-11-06 DIAGNOSIS — Z3A18 18 weeks gestation of pregnancy: Secondary | ICD-10-CM | POA: Diagnosis not present

## 2021-11-06 DIAGNOSIS — D573 Sickle-cell trait: Secondary | ICD-10-CM

## 2021-11-06 DIAGNOSIS — F32A Depression, unspecified: Secondary | ICD-10-CM

## 2021-11-06 DIAGNOSIS — D500 Iron deficiency anemia secondary to blood loss (chronic): Secondary | ICD-10-CM

## 2021-11-06 DIAGNOSIS — F419 Anxiety disorder, unspecified: Secondary | ICD-10-CM | POA: Diagnosis not present

## 2021-11-06 NOTE — Progress Notes (Signed)
Brooke Bridges is a 36 y.o. female here to establish care and discuss anxiety.  History of Present Illness:   Chief Complaint  Patient presents with   Establish Care    HPI  Anxiety/Depression Patient here to establish care. Currently works at [employer]. She is currently not on any medication. Per pt, her symptoms are well managed. She is participating in talk therapy every 2 weeks. This has been working well for her. Denies any worsening anxiety symptoms. Denies SI/HI.   Pregnancy of more than 15 weeks  She is currently 18 weeks 4 days.She is overall managing well, denies any acute concerns. She has been dealing with morning nausea but her symptoms are manageable. She is following up with her OB/GYN   Sickle cell trait  Patient was recently diagnosed with this by her OB/GYN. She states she will follow treatment for this after her pregnancy. She is currently [redacted] weeks pregnant. Denies any other concerns.   Iron deficiency anemia She does have hx of this due to heavy periods. States she was taking Iron supplements in the past for this but not recently. Denies any other concerns.   Past Medical History:  Diagnosis Date   Sickle cell anemia (HCC)      Social History   Tobacco Use   Smoking status: Never   Smokeless tobacco: Never  Vaping Use   Vaping Use: Never used  Substance Use Topics   Alcohol use: Yes    Comment: occ   Drug use: No    Past Surgical History:  Procedure Laterality Date   WISDOM TOOTH EXTRACTION Bilateral 07/2020    Family History  Problem Relation Age of Onset   Diabetes Mother    Colon cancer Neg Hx    Breast cancer Neg Hx     No Known Allergies  Current Medications:   Current Outpatient Medications:    aspirin 81 MG chewable tablet, Chew 81 mg by mouth daily., Disp: , Rfl:    Prenatal Vit-Fe Fumarate-FA (MULTIVITAMIN-PRENATAL) 27-0.8 MG TABS tablet, Take 1 tablet by mouth daily at 12 noon., Disp: , Rfl:    valACYclovir (VALTREX) 500 MG  tablet, Take 500 mg by mouth as needed., Disp: , Rfl:    Review of Systems:   ROS Negative unless otherwise specified per HPI.   Vitals:   Vitals:   11/06/21 1346  BP: 100/60  Pulse: 76  Temp: 97.9 F (36.6 C)  TempSrc: Temporal  SpO2: 99%  Weight: 135 lb (61.2 kg)  Height: 5\' 1"  (1.549 m)     Body mass index is 25.51 kg/m.  Physical Exam:   Physical Exam Vitals and nursing note reviewed.  Constitutional:      General: She is not in acute distress.    Appearance: She is well-developed. She is not ill-appearing or toxic-appearing.  Cardiovascular:     Rate and Rhythm: Normal rate and regular rhythm.     Pulses: Normal pulses.     Heart sounds: Normal heart sounds, S1 normal and S2 normal.  Pulmonary:     Effort: Pulmonary effort is normal.     Breath sounds: Normal breath sounds.  Skin:    General: Skin is warm and dry.  Neurological:     Mental Status: She is alert.     GCS: GCS eye subscore is 4. GCS verbal subscore is 5. GCS motor subscore is 6.  Psychiatric:        Speech: Speech normal.        Behavior:  Behavior normal. Behavior is cooperative.     Assessment and Plan:   Sickle cell trait (HCC) No acute issues New dx for her  Anxiety and depression Overall well controlled Declines need for further intervention Continue talk therapy  [redacted] weeks gestation of pregnancy Compliant with Ob-Gyn care Denies complications/concerns  Iron deficiency anemia due to chronic blood loss Continue prenatal Currently managed by ob-gyn Continue to monitor   I,Savera Zaman,acting as a scribe for [employer], PA.,have documented all relevant documentation on the behalf of Jarold Motto, PA,as directed by  Jarold Motto, PA while in the presence of Jarold Motto, [place].   I, Jarold Motto, [place], have reviewed all documentation for this visit. The documentation on 11/06/21 for the exam, diagnosis, procedures, and orders are all accurate and  complete.   Jarold Motto, PA-C

## 2021-11-06 NOTE — Patient Instructions (Signed)
It was great to see you!  Please reach out if you need anything! Best of luck!  Take care,  Jarold Motto PA-C

## 2021-11-12 DIAGNOSIS — Z369 Encounter for antenatal screening, unspecified: Secondary | ICD-10-CM | POA: Diagnosis not present

## 2021-11-12 DIAGNOSIS — Z3689 Encounter for other specified antenatal screening: Secondary | ICD-10-CM | POA: Diagnosis not present

## 2021-11-12 DIAGNOSIS — Z3A19 19 weeks gestation of pregnancy: Secondary | ICD-10-CM | POA: Diagnosis not present

## 2021-11-20 DIAGNOSIS — F4322 Adjustment disorder with anxiety: Secondary | ICD-10-CM | POA: Diagnosis not present

## 2021-11-28 ENCOUNTER — Other Ambulatory Visit: Payer: Self-pay

## 2021-11-28 ENCOUNTER — Inpatient Hospital Stay (HOSPITAL_COMMUNITY)
Admission: AD | Admit: 2021-11-28 | Discharge: 2021-11-28 | Disposition: A | Discharge status: Home / Self Care | Payer: Federal, State, Local not specified - PPO | Attending: Obstetrics and Gynecology | Admitting: Obstetrics and Gynecology

## 2021-11-28 ENCOUNTER — Ambulatory Visit (HOSPITAL_COMMUNITY): Admit: 2021-11-28 | Payer: Federal, State, Local not specified - PPO

## 2021-11-28 ENCOUNTER — Encounter (HOSPITAL_COMMUNITY): Payer: Self-pay | Admitting: Obstetrics and Gynecology

## 2021-11-28 DIAGNOSIS — Z3A21 21 weeks gestation of pregnancy: Secondary | ICD-10-CM | POA: Diagnosis not present

## 2021-11-28 DIAGNOSIS — D573 Sickle-cell trait: Secondary | ICD-10-CM | POA: Diagnosis not present

## 2021-11-28 DIAGNOSIS — O99012 Anemia complicating pregnancy, second trimester: Secondary | ICD-10-CM | POA: Diagnosis not present

## 2021-11-28 DIAGNOSIS — K219 Gastro-esophageal reflux disease without esophagitis: Secondary | ICD-10-CM | POA: Insufficient documentation

## 2021-11-28 DIAGNOSIS — O212 Late vomiting of pregnancy: Secondary | ICD-10-CM | POA: Insufficient documentation

## 2021-11-28 DIAGNOSIS — O219 Vomiting of pregnancy, unspecified: Secondary | ICD-10-CM

## 2021-11-28 DIAGNOSIS — O99612 Diseases of the digestive system complicating pregnancy, second trimester: Secondary | ICD-10-CM | POA: Diagnosis not present

## 2021-11-28 LAB — URINALYSIS, ROUTINE W REFLEX MICROSCOPIC
Bilirubin Urine: NEGATIVE
Glucose, UA: NEGATIVE mg/dL
Hgb urine dipstick: NEGATIVE
Ketones, ur: NEGATIVE mg/dL
Leukocytes,Ua: NEGATIVE
Nitrite: NEGATIVE
Protein, ur: NEGATIVE mg/dL
Specific Gravity, Urine: 1.01 (ref 1.005–1.030)
pH: 7 (ref 5.0–8.0)

## 2021-11-28 MED ORDER — ONDANSETRON 8 MG PO TBDP: 8.0000 mg | ORAL_TABLET | Freq: Three times a day (TID) | ORAL | 3 refills | Status: DC | PRN

## 2021-11-28 MED ORDER — FAMOTIDINE 20 MG PO TABS: 20.0000 mg | ORAL_TABLET | Freq: Two times a day (BID) | ORAL | 3 refills | Status: DC

## 2021-11-28 MED ORDER — ONDANSETRON HCL 4 MG/2ML IJ SOLN
4.0000 mg | Freq: Once | INTRAMUSCULAR | Status: AC
  Administered 2021-11-28: 4 mg via INTRAVENOUS
  Filled 2021-11-28: qty 2

## 2021-11-28 MED ORDER — FAMOTIDINE IN NACL 20-0.9 MG/50ML-% IV SOLN
20.0000 mg | Freq: Once | INTRAVENOUS | Status: AC
  Administered 2021-11-28: 20 mg via INTRAVENOUS
  Filled 2021-11-28: qty 50

## 2021-11-28 MED ORDER — FAMOTIDINE 20 MG PO TABS
20.0000 mg | ORAL_TABLET | Freq: Once | ORAL | Status: DC
  Filled 2021-11-28: qty 1

## 2021-11-28 MED ORDER — ONDANSETRON 4 MG PO TBDP
8.0000 mg | ORAL_TABLET | Freq: Once | ORAL | Status: DC
  Filled 2021-11-28: qty 2

## 2021-11-28 MED ORDER — LACTATED RINGERS IV BOLUS
1000.0000 mL | Freq: Once | INTRAVENOUS | Status: AC
  Administered 2021-11-28: 1000 mL via INTRAVENOUS

## 2021-11-28 NOTE — MAU Note (Signed)
Brooke Bridges is a 36 y.o. at [redacted]w[redacted]d here in MAU reporting: N/V, states vomited blood once today.  Reports also has sore throat from vomiting.  Reports vomiting today more than usual.  Denies pregnancy related issues, no LOF or VB.  Endorses +FM. LMP: N/A Onset of complaint: today Pain score: 7 Vitals:   11/28/21 1739  BP: 116/71  Pulse: 79  Resp: 18  Temp: 98.8 F (37.1 C)  SpO2: 100%     FHT: 160 bpm Lab orders placed from triage:   UA

## 2021-11-28 NOTE — MAU Provider Note (Addendum)
History     [id number]  Arrival date and time: 11/28/21 1723    Chief Complaint  Patient presents with   Nausea   Emesis     HPI Brooke Bridges is a 36 y.o. at [redacted]w[redacted]d with PMHx notable for Anxiety depression, sickle cell trait, iron deficiency anemia, who presents for daily nausea and vomiting.  Patient states that she has been nauseous and throwing up daily since 12 weeks.  Her symptoms are worse when laying flat at night.  She will wake up in the morning and have coughing.  This is followed by vomiting.  She feels sensation in her chest that feels like heartburn.  Her prenatal care provider prescribed her pyridoxine which has not been helpful. She has made diet modifications without improvement. TUMS does not help her reflux. No other symptoms. No urinary symptoms. No recent sick contacts or symptoms.  This morning vomiting was worse and she showed me a picture of emesis with streaks of blood.   Review of records from Care Everywhere: 2 [facility] records     Past Medical History:  Diagnosis Date   Sickle cell anemia (HCC)     Past Surgical History:  Procedure Laterality Date   WISDOM TOOTH EXTRACTION Bilateral 07/2020    Family History  Problem Relation Age of Onset   Diabetes Mother    Colon cancer Neg Hx    Breast cancer Neg Hx     Social History   Socioeconomic History   Marital status: Single    Spouse name: Not on file   Number of children: Not on file   Years of education: Not on file   Highest education level: Not on file  Occupational History   Not on file  Tobacco Use   Smoking status: Never   Smokeless tobacco: Never  Vaping Use   Vaping Use: Never used  Substance and Sexual Activity   Alcohol use: Not Currently    Comment: occ   Drug use: No   Sexual activity: Not Currently    Birth control/protection: None  Other Topics Concern   Not on file  Social History Narrative   Works at the [employer] by self   Social Determinants of Health    Financial Resource Strain: Not on file  Food Insecurity: Not on file  Transportation Needs: Not on file  Physical Activity: Not on file  Stress: Not on file  Social Connections: Not on file  Intimate Partner Violence: Not on file    No Known Allergies  No current facility-administered medications on file prior to encounter.   Current Outpatient Medications on File Prior to Encounter  Medication Sig Dispense Refill   aspirin 81 MG chewable tablet Chew 81 mg by mouth daily.     Prenatal Vit-Fe Fumarate-FA (MULTIVITAMIN-PRENATAL) 27-0.8 MG TABS tablet Take 1 tablet by mouth daily at 12 noon.     valACYclovir (VALTREX) 500 MG tablet Take 500 mg by mouth as needed.       Review of Systems  All other systems reviewed and are negative.  Pertinent positives and negative per HPI, all others reviewed and negative  Physical Exam   BP 116/71 (BP Location: Right Arm)   Pulse 79   Temp 98.8 F (37.1 C)   Resp 18   Ht 5\' 1"  (1.549 m)   Wt 64 kg   SpO2 100%   BMI 26.66 kg/m   Patient Vitals for the past 24 hrs:  BP Temp Pulse  Resp SpO2 Height Weight  11/28/21 1739 116/71 98.8 F (37.1 C) 79 18 100 % -- --  11/28/21 1733 -- -- -- -- -- 5\' 1"  (1.549 m) 64 kg    Physical Exam Constitutional:      Appearance: She is well-developed.  HENT:     Head: Normocephalic.  Eyes:     Pupils: Pupils are equal, round, and reactive to light.  Cardiovascular:     Rate and Rhythm: Normal rate and regular rhythm.     Heart sounds: Normal heart sounds.  Pulmonary:     Effort: Pulmonary effort is normal. No respiratory distress.     Breath sounds: Normal breath sounds.  Abdominal:     Palpations: Abdomen is soft.     Tenderness: There is no abdominal tenderness.  Genitourinary:    Vagina: No bleeding. Vaginal discharge: mucusy.    Comments: External: no lesion Vagina: small amount of white discharge     Musculoskeletal:        General: Normal range of motion.     Cervical back:  Normal range of motion and neck supple.  Skin:    General: Skin is warm and dry.  Neurological:     Mental Status: She is alert and oriented to person, place, and time.  Psychiatric:        Mood and Affect: Mood normal.        Behavior: Behavior normal.     Well appearing, tired RRR without murmur CTAB bilaterally Abdominal exam without tenderness  FHT 160 bpm   Labs Results for orders placed or performed during the hospital encounter of 11/28/21 (from the past 24 hour(s))  Urinalysis, Routine w reflex microscopic Urine, Clean Catch     Status: Abnormal   Collection Time: 11/28/21  5:50 PM  Result Value Ref Range   Color, Urine YELLOW YELLOW   APPearance HAZY (A) CLEAR   Specific Gravity, Urine 1.010 1.005 - 1.030   pH 7.0 5.0 - 8.0   Glucose, UA NEGATIVE NEGATIVE mg/dL   Hgb urine dipstick NEGATIVE NEGATIVE   Bilirubin Urine NEGATIVE NEGATIVE   Ketones, ur NEGATIVE NEGATIVE mg/dL   Protein, ur NEGATIVE NEGATIVE mg/dL   Nitrite NEGATIVE NEGATIVE   Leukocytes,Ua NEGATIVE NEGATIVE    Imaging No results found.  MAU Course  Procedures Lab Orders         Urinalysis, Routine w reflex microscopic Urine, Clean Catch     Meds ordered this encounter  Medications   DISCONTD: ondansetron (ZOFRAN-ODT) disintegrating tablet 8 mg   DISCONTD: famotidine (PEPCID) tablet 20 mg   ondansetron (ZOFRAN-ODT) 8 MG disintegrating tablet    Sig: Take 1 tablet (8 mg total) by mouth every 8 (eight) hours as needed for nausea or vomiting.    Dispense:  20 tablet    Refill:  3    Order Specific Question:   Supervising Provider    Answer:   11/30/21 [[id number]]   famotidine (PEPCID) 20 MG tablet    Sig: Take 1 tablet (20 mg total) by mouth 2 (two) times daily.    Dispense:  60 tablet    Refill:  3    Order Specific Question:   Supervising Provider    Answer:   Reva Bores [[id number]]   lactated ringers bolus 1,000 mL   famotidine (PEPCID) IVPB 20 mg premix   ondansetron (ZOFRAN)  injection 4 mg   Imaging Orders  No imaging studies ordered today    MDM mild  Assessment  and Plan    Nausea/vomiting in pregnancy -     Discharge patient  [redacted] weeks gestation of pregnancy -     Discharge patient  Gastroesophageal reflux disease without esophagitis -     Discharge patient  Other orders -     Urinalysis, Routine w reflex microscopic; Standing -     Ondansetron; Take 1 tablet (8 mg total) by mouth every 8 (eight) hours as needed for nausea or vomiting.  Dispense: 20 tablet; Refill: 3 -     Famotidine; Take 1 tablet (20 mg total) by mouth 2 (two) times daily.  Dispense: 60 tablet; Refill: 3 -     lactated ringers -     Famotidine in NaCl -     Ondansetron HCl    See above order and diagnoses. Suspect GERD causing vomiting. Blood streaks are likely due to esophagitis. Low suspicion for [employer] tear. Treating with IV LR bolus now. IV zofran and pepcid. Will prescribe zofran and pepcid on discharge   #FWB FHT doptones present  11/28/2021 20:03: Patient has received approximately 500cc along with IV meds. Is feeling better. Medications sent to pharmacy. Discussed precautions   Dispo: discharged to home in stable condition.   Discharge Instructions     Discharge patient   Complete by: As directed    Discharge disposition: 01-Home or Self Care   Discharge patient date: 11/28/2021       Ilda Basset, MD [facility] FM PGY3 Visiting Resident 11/28/21 8:04 PM  Attestation of Supervision of Resident:  I confirm that I have verified the information documented in the  resident's   note and that I have also personally reperformed the history, physical exam and all medical decision making activities.  I have verified that all services and findings are accurately documented in this student's note; and I agree with management and plan as outlined in the documentation. I have also made any necessary editorial changes.  Thressa Sheller DNP, CNM  11/28/21  8:21 PM  '

## 2021-12-17 DIAGNOSIS — O26899 Other specified pregnancy related conditions, unspecified trimester: Secondary | ICD-10-CM | POA: Diagnosis not present

## 2021-12-17 DIAGNOSIS — Z6791 Unspecified blood type, Rh negative: Secondary | ICD-10-CM | POA: Diagnosis not present

## 2021-12-17 DIAGNOSIS — Z369 Encounter for antenatal screening, unspecified: Secondary | ICD-10-CM | POA: Diagnosis not present

## 2021-12-17 DIAGNOSIS — Z113 Encounter for screening for infections with a predominantly sexual mode of transmission: Secondary | ICD-10-CM | POA: Diagnosis not present

## 2021-12-18 DIAGNOSIS — F4322 Adjustment disorder with anxiety: Secondary | ICD-10-CM | POA: Diagnosis not present

## 2022-01-06 ENCOUNTER — Encounter: Payer: Self-pay | Admitting: *Deleted

## 2022-01-06 ENCOUNTER — Inpatient Hospital Stay (HOSPITAL_COMMUNITY)
Admission: AD | Admit: 2022-01-06 | Payer: Federal, State, Local not specified - PPO | Source: Home / Self Care | Admitting: Family Medicine

## 2022-01-14 DIAGNOSIS — D573 Sickle-cell trait: Secondary | ICD-10-CM | POA: Diagnosis not present

## 2022-01-14 DIAGNOSIS — Z6741 Type O blood, Rh negative: Secondary | ICD-10-CM | POA: Diagnosis not present

## 2022-01-14 DIAGNOSIS — N898 Other specified noninflammatory disorders of vagina: Secondary | ICD-10-CM | POA: Diagnosis not present

## 2022-01-14 DIAGNOSIS — Z362 Encounter for other antenatal screening follow-up: Secondary | ICD-10-CM | POA: Diagnosis not present

## 2022-01-14 DIAGNOSIS — Z3A24 24 weeks gestation of pregnancy: Secondary | ICD-10-CM | POA: Diagnosis not present

## 2022-01-30 DIAGNOSIS — Z3A30 30 weeks gestation of pregnancy: Secondary | ICD-10-CM | POA: Diagnosis not present

## 2022-01-30 DIAGNOSIS — O99019 Anemia complicating pregnancy, unspecified trimester: Secondary | ICD-10-CM | POA: Diagnosis not present

## 2022-01-30 DIAGNOSIS — O0993 Supervision of high risk pregnancy, unspecified, third trimester: Secondary | ICD-10-CM | POA: Diagnosis not present

## 2022-02-11 DIAGNOSIS — O3410 Maternal care for benign tumor of corpus uteri, unspecified trimester: Secondary | ICD-10-CM | POA: Diagnosis not present

## 2022-02-11 DIAGNOSIS — D259 Leiomyoma of uterus, unspecified: Secondary | ICD-10-CM | POA: Diagnosis not present

## 2022-02-25 DIAGNOSIS — Z23 Encounter for immunization: Secondary | ICD-10-CM | POA: Diagnosis not present

## 2022-03-01 DIAGNOSIS — O98813 Other maternal infectious and parasitic diseases complicating pregnancy, third trimester: Secondary | ICD-10-CM | POA: Diagnosis not present

## 2022-03-01 DIAGNOSIS — O09513 Supervision of elderly primigravida, third trimester: Secondary | ICD-10-CM | POA: Diagnosis not present

## 2022-03-01 DIAGNOSIS — O99013 Anemia complicating pregnancy, third trimester: Secondary | ICD-10-CM | POA: Diagnosis not present

## 2022-03-01 DIAGNOSIS — Z3A34 34 weeks gestation of pregnancy: Secondary | ICD-10-CM | POA: Diagnosis not present

## 2022-03-01 DIAGNOSIS — Z79899 Other long term (current) drug therapy: Secondary | ICD-10-CM | POA: Diagnosis not present

## 2022-03-01 DIAGNOSIS — O4693 Antepartum hemorrhage, unspecified, third trimester: Secondary | ICD-10-CM | POA: Diagnosis not present

## 2022-03-01 DIAGNOSIS — D571 Sickle-cell disease without crisis: Secondary | ICD-10-CM | POA: Diagnosis not present

## 2022-03-01 DIAGNOSIS — Z7982 Long term (current) use of aspirin: Secondary | ICD-10-CM | POA: Diagnosis not present

## 2022-03-01 DIAGNOSIS — O42013 Preterm premature rupture of membranes, onset of labor within 24 hours of rupture, third trimester: Secondary | ICD-10-CM | POA: Diagnosis not present

## 2022-03-01 DIAGNOSIS — O26893 Other specified pregnancy related conditions, third trimester: Secondary | ICD-10-CM | POA: Diagnosis not present

## 2022-03-01 DIAGNOSIS — R102 Pelvic and perineal pain: Secondary | ICD-10-CM | POA: Diagnosis not present

## 2022-03-12 DIAGNOSIS — Z3685 Encounter for antenatal screening for Streptococcus B: Secondary | ICD-10-CM | POA: Diagnosis not present

## 2022-03-12 DIAGNOSIS — O43199 Other malformation of placenta, unspecified trimester: Secondary | ICD-10-CM | POA: Diagnosis not present

## 2022-03-25 DIAGNOSIS — S30824S Blister (nonthermal) of vagina and vulva, sequela: Secondary | ICD-10-CM | POA: Diagnosis not present

## 2022-03-25 DIAGNOSIS — Z3A38 38 weeks gestation of pregnancy: Secondary | ICD-10-CM | POA: Diagnosis not present

## 2022-03-26 DIAGNOSIS — M9905 Segmental and somatic dysfunction of pelvic region: Secondary | ICD-10-CM | POA: Diagnosis not present

## 2022-03-26 DIAGNOSIS — M9902 Segmental and somatic dysfunction of thoracic region: Secondary | ICD-10-CM | POA: Diagnosis not present

## 2022-03-26 DIAGNOSIS — M9904 Segmental and somatic dysfunction of sacral region: Secondary | ICD-10-CM | POA: Diagnosis not present

## 2022-03-26 DIAGNOSIS — M9903 Segmental and somatic dysfunction of lumbar region: Secondary | ICD-10-CM | POA: Diagnosis not present

## 2022-03-27 ENCOUNTER — Encounter: Payer: Self-pay | Admitting: *Deleted

## 2022-04-02 DIAGNOSIS — M9903 Segmental and somatic dysfunction of lumbar region: Secondary | ICD-10-CM | POA: Diagnosis not present

## 2022-04-02 DIAGNOSIS — M9902 Segmental and somatic dysfunction of thoracic region: Secondary | ICD-10-CM | POA: Diagnosis not present

## 2022-04-02 DIAGNOSIS — M9904 Segmental and somatic dysfunction of sacral region: Secondary | ICD-10-CM | POA: Diagnosis not present

## 2022-04-02 DIAGNOSIS — M9905 Segmental and somatic dysfunction of pelvic region: Secondary | ICD-10-CM | POA: Diagnosis not present

## 2022-04-07 DIAGNOSIS — Z3A40 40 weeks gestation of pregnancy: Secondary | ICD-10-CM | POA: Diagnosis not present

## 2022-04-07 DIAGNOSIS — O4193X0 Disorder of amniotic fluid and membranes, unspecified, third trimester, not applicable or unspecified: Secondary | ICD-10-CM | POA: Diagnosis not present

## 2022-04-07 DIAGNOSIS — O99820 Streptococcus B carrier state complicating pregnancy: Secondary | ICD-10-CM | POA: Diagnosis not present

## 2022-04-08 DIAGNOSIS — B009 Herpesviral infection, unspecified: Secondary | ICD-10-CM | POA: Diagnosis not present

## 2022-04-08 DIAGNOSIS — O9852 Other viral diseases complicating childbirth: Secondary | ICD-10-CM | POA: Diagnosis not present

## 2022-04-08 DIAGNOSIS — O4103X0 Oligohydramnios, third trimester, not applicable or unspecified: Secondary | ICD-10-CM | POA: Diagnosis not present

## 2022-04-08 DIAGNOSIS — O99820 Streptococcus B carrier state complicating pregnancy: Secondary | ICD-10-CM | POA: Diagnosis not present

## 2022-04-08 DIAGNOSIS — D573 Sickle-cell trait: Secondary | ICD-10-CM | POA: Diagnosis not present

## 2022-04-08 DIAGNOSIS — O480 Post-term pregnancy: Secondary | ICD-10-CM | POA: Diagnosis not present

## 2022-04-08 DIAGNOSIS — O9902 Anemia complicating childbirth: Secondary | ICD-10-CM | POA: Diagnosis not present

## 2022-04-08 DIAGNOSIS — Z3403 Encounter for supervision of normal first pregnancy, third trimester: Secondary | ICD-10-CM | POA: Diagnosis not present

## 2022-04-08 DIAGNOSIS — Z3A40 40 weeks gestation of pregnancy: Secondary | ICD-10-CM | POA: Diagnosis not present

## 2022-04-08 DIAGNOSIS — D259 Leiomyoma of uterus, unspecified: Secondary | ICD-10-CM | POA: Diagnosis not present

## 2022-04-08 DIAGNOSIS — O3413 Maternal care for benign tumor of corpus uteri, third trimester: Secondary | ICD-10-CM | POA: Diagnosis not present

## 2022-04-08 DIAGNOSIS — O99824 Streptococcus B carrier state complicating childbirth: Secondary | ICD-10-CM | POA: Diagnosis not present

## 2022-04-08 DIAGNOSIS — O4193X0 Disorder of amniotic fluid and membranes, unspecified, third trimester, not applicable or unspecified: Secondary | ICD-10-CM | POA: Diagnosis not present

## 2022-04-09 DIAGNOSIS — O480 Post-term pregnancy: Secondary | ICD-10-CM | POA: Diagnosis not present

## 2022-04-09 DIAGNOSIS — O09523 Supervision of elderly multigravida, third trimester: Secondary | ICD-10-CM | POA: Diagnosis not present

## 2022-04-09 DIAGNOSIS — Z20818 Contact with and (suspected) exposure to other bacterial communicable diseases: Secondary | ICD-10-CM | POA: Diagnosis not present

## 2022-04-09 DIAGNOSIS — Z2882 Immunization not carried out because of caregiver refusal: Secondary | ICD-10-CM | POA: Diagnosis not present

## 2022-04-09 DIAGNOSIS — Z3A40 40 weeks gestation of pregnancy: Secondary | ICD-10-CM | POA: Diagnosis not present

## 2022-04-09 DIAGNOSIS — Q170 Accessory auricle: Secondary | ICD-10-CM | POA: Diagnosis not present

## 2022-04-09 DIAGNOSIS — Z051 Observation and evaluation of newborn for suspected infectious condition ruled out: Secondary | ICD-10-CM | POA: Diagnosis not present

## 2022-05-20 DIAGNOSIS — D259 Leiomyoma of uterus, unspecified: Secondary | ICD-10-CM | POA: Diagnosis not present

## 2022-05-20 DIAGNOSIS — Z862 Personal history of diseases of the blood and blood-forming organs and certain disorders involving the immune mechanism: Secondary | ICD-10-CM | POA: Diagnosis not present

## 2022-05-20 DIAGNOSIS — Z1330 Encounter for screening examination for mental health and behavioral disorders, unspecified: Secondary | ICD-10-CM | POA: Diagnosis not present

## 2022-05-20 DIAGNOSIS — K5900 Constipation, unspecified: Secondary | ICD-10-CM | POA: Diagnosis not present

## 2022-05-20 DIAGNOSIS — Z8759 Personal history of other complications of pregnancy, childbirth and the puerperium: Secondary | ICD-10-CM | POA: Diagnosis not present

## 2022-05-20 DIAGNOSIS — Z1329 Encounter for screening for other suspected endocrine disorder: Secondary | ICD-10-CM | POA: Diagnosis not present

## 2022-09-10 ENCOUNTER — Encounter (HOSPITAL_COMMUNITY): Payer: Self-pay

## 2022-09-10 ENCOUNTER — Ambulatory Visit (HOSPITAL_COMMUNITY)
Admission: EM | Admit: 2022-09-10 | Discharge: 2022-09-10 | Disposition: A | Discharge status: Home / Self Care | Payer: Federal, State, Local not specified - PPO | Attending: Nurse Practitioner | Admitting: Nurse Practitioner

## 2022-09-10 DIAGNOSIS — J029 Acute pharyngitis, unspecified: Secondary | ICD-10-CM | POA: Diagnosis not present

## 2022-09-10 DIAGNOSIS — R051 Acute cough: Secondary | ICD-10-CM | POA: Insufficient documentation

## 2022-09-10 DIAGNOSIS — J0110 Acute frontal sinusitis, unspecified: Secondary | ICD-10-CM | POA: Diagnosis not present

## 2022-09-10 DIAGNOSIS — Z1152 Encounter for screening for COVID-19: Secondary | ICD-10-CM | POA: Insufficient documentation

## 2022-09-10 DIAGNOSIS — D573 Sickle-cell trait: Secondary | ICD-10-CM | POA: Insufficient documentation

## 2022-09-10 DIAGNOSIS — J069 Acute upper respiratory infection, unspecified: Secondary | ICD-10-CM

## 2022-09-10 DIAGNOSIS — H9209 Otalgia, unspecified ear: Secondary | ICD-10-CM | POA: Insufficient documentation

## 2022-09-10 LAB — POCT INFLUENZA A/B
Influenza A, POC: NEGATIVE
Influenza B, POC: NEGATIVE

## 2022-09-10 LAB — POCT RAPID STREP A (OFFICE): Rapid Strep A Screen: NEGATIVE

## 2022-09-10 LAB — SARS CORONAVIRUS 2 (TAT 6-24 HRS): SARS Coronavirus 2: NEGATIVE

## 2022-09-10 MED ORDER — AMOXICILLIN-POT CLAVULANATE 875-125 MG PO TABS: 1.0000 | ORAL_TABLET | Freq: Two times a day (BID) | ORAL | 0 refills | Status: DC

## 2022-09-10 MED ORDER — PSEUDOEPH-BROMPHEN-DM 30-2-10 MG/5ML PO SYRP: 5.0000 mL | ORAL_SOLUTION | Freq: Four times a day (QID) | ORAL | 0 refills | Status: DC | PRN

## 2022-09-10 NOTE — Discharge Instructions (Addendum)
Your COVID is pending. Your results will show in your MyChart. Any positive results will result in a phone call from a nurse with next steps and recommendations.  Your Influenza and Strep Test are all negative. You have been prescribed Augmentin 875/125 twice daily for 7 days.  Due to your prolonged symptoms.  You have also been prescribed Bromed for cough and congestion.  Hold Mucinex due to elevation in blood pressure. We encourage you to use Tylenol for your pain (Remember to use as directed do not exceed daily dosing recommendations). We also encourage salt water gargles for your sore throat. You should also consider throat lozenges and chloraseptic spray.

## 2022-09-10 NOTE — ED Provider Notes (Addendum)
[facility]    CSN: [id number] Arrival date & time: 09/10/22  2952      History   Chief Complaint Chief Complaint  Patient presents with   Cough    HPI Brooke Bridges is a 37 y.o. female.   HPI  She is in today for evaluation of persistent cough.  She is having a 2-week history of headaches congestion with now ear pain and sore throat.  She reports that she is having some difficulty with taste and smell.  She reports that she is taking Mucinex, Tylenol, target for losing with no relief.  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  She also reports that her 71-month-old was sick last week.  She denies any shortness of breath, wheezing or chest pain.  She denies a history of allergies.  She reports that she has sickle cell trait. Past Medical History:  Diagnosis Date   Sickle cell anemia (HCC)     Patient Active Problem List   Diagnosis Date Noted   Sickle cell trait (HCC) 10/10/2021    Past Surgical History:  Procedure Laterality Date   WISDOM TOOTH EXTRACTION Bilateral 07/2020    OB History     Gravida  1   Para      Term      Preterm      AB      Living         SAB      IAB      Ectopic      Multiple      Live Births               Home Medications    Prior to Admission medications   Medication Sig Start Date End Date Taking? Authorizing Provider  amoxicillin-clavulanate (AUGMENTIN) 875-125 MG tablet Take 1 tablet by mouth every 12 (twelve) hours. 09/10/22  Yes Barbette Merino, NP  brompheniramine-pseudoephedrine-DM 30-2-10 MG/5ML syrup Take 5 mLs by mouth 4 (four) times daily as needed. 09/10/22  Yes Barbette Merino, NP  Prenatal Vit-Fe Fumarate-FA (MULTIVITAMIN-PRENATAL) 27-0.8 MG TABS tablet Take 1 tablet by mouth daily at 12 noon.    [provider]  valACYclovir (VALTREX) 500 MG tablet Take 500 mg by mouth as needed. 10/02/21   [provider]    Family History Family History  Problem Relation Age of  Onset   Diabetes Mother    Colon cancer Neg Hx    Breast cancer Neg Hx     Social History Social History   Tobacco Use   Smoking status: Never   Smokeless tobacco: Never  Vaping Use   Vaping Use: Never used  Substance Use Topics   Alcohol use: Not Currently    Comment: occ   Drug use: No     Allergies   Patient has no known allergies.   Review of Systems Review of Systems   Physical Exam Triage Vital Signs ED Triage Vitals  Enc Vitals Group     BP 09/10/22 0943 (!) 145/96     Pulse Rate 09/10/22 0943 (!) 110     Resp 09/10/22 0943 20     Temp 09/10/22 0943 99.6 F (37.6 C)     Temp Source 09/10/22 0943 Oral     SpO2 09/10/22 0943 96 %     Weight --      Height --      Head Circumference --      Peak Flow --  Pain Score 09/10/22 0944 5     Pain Loc --      Pain Edu? --      Excl. in GC? --    No data found.  Updated Vital Signs BP (!) 145/96 (BP Location: Left Arm)   Pulse (!) 110   Temp 99.6 F (37.6 C) (Oral)   Resp 20   SpO2 96%   Breastfeeding No   Visual Acuity Right Eye Distance:   Left Eye Distance:   Bilateral Distance:    Right Eye Near:   Left Eye Near:    Bilateral Near:     Physical Exam Constitutional:      General: She is not in acute distress.    Appearance: She is normal weight.  HENT:     Head: Normocephalic and atraumatic.     Right Ear: Tympanic membrane normal.     Left Ear: Tympanic membrane normal.     Nose: Nose normal.     Mouth/Throat:     Mouth: Mucous membranes are moist.  Eyes:     Pupils: Pupils are equal, round, and reactive to light.  Cardiovascular:     Rate and Rhythm: Normal rate and regular rhythm.     Pulses: Normal pulses.     Heart sounds: Normal heart sounds.  Pulmonary:     Effort: Pulmonary effort is normal.     Breath sounds: Normal breath sounds.  Musculoskeletal:        General: Normal range of motion.     Cervical back: Normal range of motion.  Skin:    General: Skin is warm  and dry.     Capillary Refill: Capillary refill takes less than 2 seconds.  Neurological:     General: No focal deficit present.     Mental Status: She is alert and oriented to person, place, and time.  Psychiatric:        Mood and Affect: Mood normal.        Behavior: Behavior normal.      UC Treatments / Results  Labs (all labs ordered are listed, but only abnormal results are displayed) Labs Reviewed  SARS CORONAVIRUS 2 (TAT 6-24 HRS)  POCT RAPID STREP A (OFFICE)  POCT INFLUENZA A/B    EKG   Radiology No results found.  Procedures Procedures (including critical care time)  Medications Ordered in UC Medications - No data to display  Initial Impression / Assessment and Plan / UC Course  I have reviewed the triage vital signs and the nursing notes.  Pertinent labs & imaging results that were available during my care of the patient were reviewed by me and considered in my medical decision making (see chart for details).     Sore throat and ear pain. Final Clinical Impressions(s) / UC Diagnoses   Final diagnoses:  Acute non-recurrent frontal sinusitis  Acute cough     Discharge Instructions      Your COVID is pending. Your results will show in your MyChart. Any positive results will result in a phone call from a nurse with next steps in treatment and recommendations.  Your Influenza and Strep Test are all negative. You have been prescribed Augmentin 875/125 twice daily for 7 days.  Due to your prolonged symptoms.  You have also been prescribed Bromed for cough and congestion.  Hold Mucinex due to elevation in blood pressure. We encourage you to use Tylenol for your pain (Remember to use as directed do not exceed daily dosing recommendations). We  also encourage salt water gargles for your sore throat. You should also consider throat lozenges and chloraseptic spray.          ED Prescriptions     Medication Sig Dispense Auth. Provider    amoxicillin-clavulanate (AUGMENTIN) 875-125 MG tablet Take 1 tablet by mouth every 12 (twelve) hours. 14 tablet Thad Ranger M, NP   brompheniramine-pseudoephedrine-DM 30-2-10 MG/5ML syrup Take 5 mLs by mouth 4 (four) times daily as needed. 120 mL Barbette Merino, NP      PDMP not reviewed this encounter.   Barbette Merino, NP 09/10/22 1031    Thad Ranger [place], [place] 09/10/22 1047

## 2022-09-10 NOTE — ED Triage Notes (Signed)
Pt c/o cough, otalgia, sore throat, cannot taste or smell, headache.   Onset ~ 2 days ago   Reports child was recently ill with similar sxs and tx w/ amoxil.

## 2022-10-28 ENCOUNTER — Encounter: Payer: Federal, State, Local not specified - PPO | Admitting: Physician Assistant

## 2022-10-28 ENCOUNTER — Ambulatory Visit: Payer: Federal, State, Local not specified - PPO

## 2022-11-12 ENCOUNTER — Encounter (INDEPENDENT_AMBULATORY_CARE_PROVIDER_SITE_OTHER): Payer: Self-pay

## 2022-12-09 NOTE — Progress Notes (Signed)
Subjective:    Brooke Bridges is a 37 y.o. female and is here for a comprehensive physical exam.  HPI  Health Maintenance Due  Topic Date Due   INFLUENZA VACCINE  11/13/2022    Acute Concerns: Tightness in chest Reports that on 8/24 she experienced a tightness in her left breast after bending over. States tightness lasted a couple of seconds before fading. Denies associated shortness of breath or palpitations.  Chronic Issues: None.  Health Maintenance: Immunizations -- UpToDate  Colonoscopy -- N/A Mammogram -- N/A PAP -- Last done 10/02/21 (overdue since 10/03/22). Results were normal. Plans to schedule.  Bone Density -- N/A Diet -- Healthy overall.  Exercise -- Not regularly.   Sleep habits -- Good sleep quality.  Mood -- Stable overall.   UTD with dentist? - Scheduled for November.  UTD with eye doctor? - Yes.   Weight history: Wt Readings from Last 10 Encounters:  11/28/21 141 lb 1.6 oz (64 kg)  11/06/21 135 lb (61.2 kg)  08/11/19 150 lb (68 kg)  02/05/18 139 lb (63 kg)  09/17/14 128 lb (58.1 kg)  09/16/14 128 lb 11.2 oz (58.4 kg)   There is no height or weight on file to calculate BMI. No LMP recorded.  Alcohol use:  reports that she does not currently use alcohol.  Tobacco use:  Tobacco Use: Low Risk  (09/10/2022)   Patient History    Smoking Tobacco Use: Never    Smokeless Tobacco Use: Never    Passive Exposure: Not on file   Eligible for lung cancer screening? no     11/06/2021    1:46 PM  Depression screen PHQ 2/9  Decreased Interest 0  Down, Depressed, Hopeless 0  PHQ - 2 Score 0     Other providers/specialists: Patient Care Team: Jarold Motto, [place] as PCP - General (Physician Assistant)    PMHx, SurgHx, SocialHx, Medications, and Allergies were reviewed in the Visit Navigator and updated as appropriate.   Past Medical History:  Diagnosis Date   Sickle cell anemia (HCC)      Past Surgical History:  Procedure Laterality  Date   WISDOM TOOTH EXTRACTION Bilateral 07/2020     Family History  Problem Relation Age of Onset   Diabetes Mother    Colon cancer Neg Hx    Breast cancer Neg Hx     Social History   Tobacco Use   Smoking status: Never   Smokeless tobacco: Never  Vaping Use   Vaping status: Never Used  Substance Use Topics   Alcohol use: Not Currently    Comment: occ   Drug use: No    Review of Systems:   Review of Systems  Musculoskeletal:        (+) Brest pain (left-sided, seconds long episode)    Objective:   There were no vitals taken for this visit. There is no height or weight on file to calculate BMI.   General Appearance:    Alert, cooperative, no distress, appears stated age  Head:    Normocephalic, without obvious abnormality, atraumatic  Eyes:    PERRL, conjunctiva/corneas clear, EOM's intact, fundi    benign, both eyes  Ears:    Normal TM's and external ear canals, both ears  Nose:   Nares normal, septum midline, mucosa normal, no drainage    or sinus tenderness  Throat:   Lips, mucosa, and tongue normal; teeth and gums normal  Neck:   Supple, symmetrical, trachea midline, no adenopathy;  thyroid:  no enlargement/tenderness/nodules; no carotid   bruit or JVD  Back:     Symmetric, no curvature, ROM normal, no CVA tenderness  Lungs:     Clear to auscultation bilaterally, respirations unlabored  Chest Wall:    No tenderness or deformity   Heart:    Regular rate and rhythm, S1 and S2 normal, no murmur, rub or gallop  Breast Exam:    Deferred  Abdomen:     Soft, non-tender, bowel sounds active all four quadrants,    no masses, no organomegaly  Genitalia:    Deferred  Extremities:   Extremities normal, atraumatic, no cyanosis or edema  Pulses:   2+ and symmetric all extremities  Skin:   Skin color, texture, turgor normal, no rashes or lesions  Lymph nodes:   Cervical, supraclavicular, and axillary nodes normal  Neurologic:   CNII-XII intact, normal strength,  sensation and reflexes    throughout    Assessment/Plan:   Routine physical examination Today patient counseled on age appropriate routine health concerns for screening and prevention, each reviewed and up to date or declined. Immunizations reviewed and up to date or declined. Labs ordered and reviewed. Risk factors for depression reviewed and negative. Hearing function and visual acuity are intact. ADLs screened and addressed as needed. Functional ability and level of safety reviewed and appropriate. Education, counseling and referrals performed based on assessed risks today. Patient provided with a copy of personalized plan for preventive services.  Anxiety and depression Overall controlled, per patient Continue to monitor Follow-up yearly   Iron deficiency anemia due to chronic blood loss Update blood work and provide recommendations on supplementation as indicated  Chest tightness No red flags Isolated event - she declines further work-up at this time If returns, she will reach out If worsening or severe symptom(s), she will go to the ER   I,Emily Lagle,acting as a scribe for [employer], PA.,have documented all relevant documentation on the behalf of Jarold Motto, PA,as directed by  Jarold Motto, PA while in the presence of Jarold Motto, [place].  I, Larey Brick, have reviewed all documentation for this visit. The documentation on 12/09/22 for the exam, diagnosis, procedures, and orders are all accurate and complete.   Jarold Motto, PA-C [facility]

## 2022-12-11 ENCOUNTER — Ambulatory Visit (INDEPENDENT_AMBULATORY_CARE_PROVIDER_SITE_OTHER): Payer: Federal, State, Local not specified - PPO | Admitting: Physician Assistant

## 2022-12-11 ENCOUNTER — Encounter: Payer: Self-pay | Admitting: Physician Assistant

## 2022-12-11 VITALS — BP 102/70 | HR 60 | Temp 97.7°F | Ht 61.0 in | Wt 133.5 lb

## 2022-12-11 DIAGNOSIS — F32A Depression, unspecified: Secondary | ICD-10-CM

## 2022-12-11 DIAGNOSIS — D500 Iron deficiency anemia secondary to blood loss (chronic): Secondary | ICD-10-CM | POA: Diagnosis not present

## 2022-12-11 DIAGNOSIS — Z0000 Encounter for general adult medical examination without abnormal findings: Secondary | ICD-10-CM | POA: Diagnosis not present

## 2022-12-11 DIAGNOSIS — R0789 Other chest pain: Secondary | ICD-10-CM

## 2022-12-11 DIAGNOSIS — F419 Anxiety disorder, unspecified: Secondary | ICD-10-CM

## 2022-12-11 LAB — COMPREHENSIVE METABOLIC PANEL
ALT: 23 U/L (ref 0–35)
AST: 25 U/L (ref 0–37)
Albumin: 4.3 g/dL (ref 3.5–5.2)
Alkaline Phosphatase: 78 U/L (ref 39–117)
BUN: 10 mg/dL (ref 6–23)
CO2: 26 mEq/L (ref 19–32)
Calcium: 9.2 mg/dL (ref 8.4–10.5)
Chloride: 103 mEq/L (ref 96–112)
Creatinine, Ser: 0.75 mg/dL (ref 0.40–1.20)
GFR: 100.6 mL/min (ref >60.00)
Glucose, Bld: 81 mg/dL (ref 70–99)
Potassium: 4.2 mEq/L (ref 3.5–5.1)
Sodium: 135 mEq/L (ref 135–145)
Total Bilirubin: 0.4 mg/dL (ref 0.2–1.2)
Total Protein: 7.8 g/dL (ref 6.0–8.3)

## 2022-12-11 LAB — LIPID PANEL
Cholesterol: 146 mg/dL (ref 0–200)
HDL: 64.7 mg/dL (ref >39.00)
LDL Cholesterol: 72 mg/dL (ref 0–99)
NonHDL: 81.03
Total CHOL/HDL Ratio: 2
Triglycerides: 47 mg/dL (ref 0.0–149.0)
VLDL: 9.4 mg/dL (ref 0.0–40.0)

## 2022-12-11 LAB — IBC + FERRITIN
Ferritin: 15 ng/mL (ref 10.0–291.0)
Iron: 65 ug/dL (ref 42–145)
Saturation Ratios: 13.7 % — ABNORMAL LOW (ref 20.0–50.0)
TIBC: 473.2 ug/dL — ABNORMAL HIGH (ref 250.0–450.0)
Transferrin: 338 mg/dL (ref 212.0–360.0)

## 2022-12-11 LAB — CBC WITH DIFFERENTIAL/PLATELET
Basophils Absolute: 0 10*3/uL (ref 0.0–0.1)
Basophils Relative: 0.5 % (ref 0.0–3.0)
Eosinophils Absolute: 0.1 10*3/uL (ref 0.0–0.7)
Eosinophils Relative: 1.4 % (ref 0.0–5.0)
HCT: 40.7 % (ref 36.0–46.0)
Hemoglobin: 13.4 g/dL (ref 12.0–15.0)
Lymphocytes Relative: 38.7 % (ref 12.0–46.0)
Lymphs Abs: 1.9 10*3/uL (ref 0.7–4.0)
MCHC: 32.8 g/dL (ref 30.0–36.0)
MCV: 86 fl (ref 78.0–100.0)
Monocytes Absolute: 0.4 10*3/uL (ref 0.1–1.0)
Monocytes Relative: 8.3 % (ref 3.0–12.0)
Neutro Abs: 2.6 10*3/uL (ref 1.4–7.7)
Neutrophils Relative %: 51.1 % (ref 43.0–77.0)
Platelets: 296 10*3/uL (ref 150.0–400.0)
RBC: 4.74 Mil/uL (ref 3.87–5.11)
RDW: 14.1 % (ref 11.5–15.5)
WBC: 5 10*3/uL (ref 4.0–10.5)

## 2022-12-11 LAB — TSH: TSH: 1.26 u[IU]/mL (ref 0.35–5.50)

## 2022-12-11 NOTE — Patient Instructions (Signed)
It was great to see you!  If chest pain returns, please let me know -- if SEVERE, go to the ER  Please go to the lab for blood work.   Our office will call you with your results unless you have chosen to receive results via MyChart.  If your blood work is normal we will follow-up each year for physicals and as scheduled for chronic medical problems.  If anything is abnormal we will treat accordingly and get you in for a follow-up.  Take care,  Lelon Mast

## 2023-02-02 NOTE — Progress Notes (Signed)
Brooke Bridges is a 37 y.o. female {ELNP/FU:31256} History of Present Illness:  No chief complaint on file.   HPI Ears: {ELStarters:31163} *** *** *** *** *** ***  *** *** *** *** *** Past Medical History:  Diagnosis Date   Sickle cell anemia (HCC)     Social History   Tobacco Use   Smoking status: Never   Smokeless tobacco: Never  Vaping Use   Vaping status: Never Used  Substance Use Topics   Alcohol use: Not Currently   Drug use: No   Past Surgical History:  Procedure Laterality Date   WISDOM TOOTH EXTRACTION Bilateral 07/2020   Family History  Problem Relation Age of Onset   Diabetes Mother    Colon cancer Neg Hx    Breast cancer Neg Hx    No Known Allergies Current Medications:   Current Outpatient Medications:    valACYclovir (VALTREX) 500 MG tablet, Take 500 mg by mouth as needed., Disp: , Rfl:   Review of Systems:   ROS See pertinent positives and negatives as per the HPI.  Vitals:   There were no vitals filed for this visit.   There is no height or weight on file to calculate BMI.  Physical Exam:   Physical Exam  Assessment and Plan:   There are no diagnoses linked to this encounter.            I,Emily Lagle,acting as a Neurosurgeon for [employer], PA.,have documented all relevant documentation on the behalf of Jarold Motto, PA,as directed by  Jarold Motto, PA while in the presence of Jarold Motto, [place].  *** (refresh reminder)  I, Jarold Motto, PA, have reviewed all documentation for this visit. The documentation on 02/02/23 for the exam, diagnosis, procedures, and orders are all accurate and complete.  Jarold Motto, PA-C

## 2023-02-03 ENCOUNTER — Ambulatory Visit (INDEPENDENT_AMBULATORY_CARE_PROVIDER_SITE_OTHER): Payer: Federal, State, Local not specified - PPO | Admitting: Physician Assistant

## 2023-02-03 VITALS — BP 120/70 | HR 68 | Temp 97.5°F | Ht 61.0 in | Wt 133.5 lb

## 2023-02-03 DIAGNOSIS — H9202 Otalgia, left ear: Secondary | ICD-10-CM | POA: Diagnosis not present

## 2023-02-03 MED ORDER — OFLOXACIN 0.3 % OT SOLN: 5.0000 [drp] | Freq: Every day | OTIC | 0 refills | Status: AC

## 2023-06-01 DIAGNOSIS — R42 Dizziness and giddiness: Secondary | ICD-10-CM | POA: Diagnosis not present

## 2023-06-01 DIAGNOSIS — J101 Influenza due to other identified influenza virus with other respiratory manifestations: Secondary | ICD-10-CM | POA: Diagnosis not present

## 2023-06-01 DIAGNOSIS — Z20822 Contact with and (suspected) exposure to covid-19: Secondary | ICD-10-CM | POA: Diagnosis not present

## 2023-06-01 DIAGNOSIS — R059 Cough, unspecified: Secondary | ICD-10-CM | POA: Diagnosis not present

## 2023-06-01 DIAGNOSIS — R61 Generalized hyperhidrosis: Secondary | ICD-10-CM | POA: Diagnosis not present

## 2023-06-03 ENCOUNTER — Ambulatory Visit: Payer: BC Managed Care – PPO

## 2023-06-09 ENCOUNTER — Ambulatory Visit (INDEPENDENT_AMBULATORY_CARE_PROVIDER_SITE_OTHER): Payer: BC Managed Care – PPO | Admitting: *Deleted

## 2023-06-09 DIAGNOSIS — Z23 Encounter for immunization: Secondary | ICD-10-CM

## 2023-06-16 ENCOUNTER — Ambulatory Visit: Payer: BC Managed Care – PPO

## 2023-06-16 DIAGNOSIS — Z111 Encounter for screening for respiratory tuberculosis: Secondary | ICD-10-CM

## 2023-06-16 NOTE — Progress Notes (Signed)
 Patient is in office today for a nurse visit for PPD, per PCP's order. Patient Injection was given in the  Left arm. Patient tolerated injection well. Left lower forearm

## 2023-06-19 ENCOUNTER — Ambulatory Visit

## 2023-10-29 DIAGNOSIS — R0981 Nasal congestion: Secondary | ICD-10-CM | POA: Diagnosis not present

## 2023-10-29 DIAGNOSIS — K047 Periapical abscess without sinus: Secondary | ICD-10-CM | POA: Diagnosis not present

## 2023-10-29 DIAGNOSIS — J069 Acute upper respiratory infection, unspecified: Secondary | ICD-10-CM | POA: Diagnosis not present

## 2023-11-19 DIAGNOSIS — B009 Herpesviral infection, unspecified: Secondary | ICD-10-CM | POA: Diagnosis not present

## 2023-11-19 DIAGNOSIS — Z86018 Personal history of other benign neoplasm: Secondary | ICD-10-CM | POA: Diagnosis not present

## 2023-11-19 DIAGNOSIS — Z01419 Encounter for gynecological examination (general) (routine) without abnormal findings: Secondary | ICD-10-CM | POA: Diagnosis not present

## 2023-12-11 DIAGNOSIS — Z86018 Personal history of other benign neoplasm: Secondary | ICD-10-CM | POA: Diagnosis not present

## 2023-12-11 DIAGNOSIS — D251 Intramural leiomyoma of uterus: Secondary | ICD-10-CM | POA: Diagnosis not present

## 2023-12-15 ENCOUNTER — Encounter: Payer: Federal, State, Local not specified - PPO | Admitting: Physician Assistant
